# Patient Record
Sex: Female | Born: 1975 | Race: Asian | Hispanic: No | Marital: Married | Smoking: Never smoker
Health system: Southern US, Community
[De-identification: ages and names within clinical notes are randomized; demographics above are authoritative.]

## PROBLEM LIST (undated history)

## (undated) DIAGNOSIS — Z789 Other specified health status: Secondary | ICD-10-CM

## (undated) DIAGNOSIS — O24419 Gestational diabetes mellitus in pregnancy, unspecified control: Secondary | ICD-10-CM

## (undated) HISTORY — DX: Gestational diabetes mellitus in pregnancy, unspecified control: O24.419

## (undated) HISTORY — PX: OTHER SURGICAL HISTORY: SHX169

## (undated) HISTORY — PX: NO PAST SURGERIES: SHX2092

---

## 2007-01-02 ENCOUNTER — Inpatient Hospital Stay (HOSPITAL_COMMUNITY): Admission: AD | Admit: 2007-01-02 | Discharge: 2007-01-02 | Payer: Self-pay | Admitting: Gynecology

## 2007-01-02 ENCOUNTER — Ambulatory Visit: Payer: Self-pay | Admitting: Obstetrics and Gynecology

## 2007-01-03 ENCOUNTER — Inpatient Hospital Stay (HOSPITAL_COMMUNITY): Admission: AD | Admit: 2007-01-03 | Discharge: 2007-01-03 | Payer: Self-pay | Admitting: Obstetrics & Gynecology

## 2007-01-08 ENCOUNTER — Ambulatory Visit: Payer: Self-pay | Admitting: Obstetrics & Gynecology

## 2007-01-15 ENCOUNTER — Inpatient Hospital Stay (HOSPITAL_COMMUNITY): Admission: AD | Admit: 2007-01-15 | Discharge: 2007-01-16 | Payer: Self-pay | Admitting: Gynecology

## 2007-01-15 ENCOUNTER — Ambulatory Visit: Payer: Self-pay | Admitting: Advanced Practice Midwife

## 2010-12-05 LAB — POCT URINALYSIS DIP (DEVICE)
Glucose, UA: NEGATIVE
Ketones, ur: NEGATIVE
Operator id: 297281
Protein, ur: NEGATIVE
Specific Gravity, Urine: 1.02
Urobilinogen, UA: 0.2

## 2010-12-05 LAB — URINE MICROSCOPIC-ADD ON

## 2010-12-05 LAB — URINALYSIS, ROUTINE W REFLEX MICROSCOPIC
Nitrite: NEGATIVE
Protein, ur: NEGATIVE
Specific Gravity, Urine: 1.01
Urobilinogen, UA: 0.2

## 2010-12-05 LAB — CBC
Hemoglobin: 13.6
MCHC: 34.3
RBC: 4.53
WBC: 9.2

## 2010-12-05 LAB — DIFFERENTIAL
Basophils Relative: 0
Lymphocytes Relative: 16
Lymphs Abs: 1.4
Monocytes Absolute: 0.7
Monocytes Relative: 8
Neutro Abs: 6.8
Neutrophils Relative %: 75

## 2010-12-05 LAB — STREP B DNA PROBE: Strep Group B Ag: NEGATIVE

## 2010-12-05 LAB — TYPE AND SCREEN: Antibody Screen: POSITIVE

## 2010-12-05 LAB — RPR: RPR Ser Ql: NONREACTIVE

## 2010-12-05 LAB — HIV ANTIBODY (ROUTINE TESTING W REFLEX): HIV: NONREACTIVE

## 2010-12-05 LAB — GC/CHLAMYDIA PROBE AMP, GENITAL: GC Probe Amp, Genital: NEGATIVE

## 2010-12-05 LAB — RUBELLA SCREEN: Rubella: >500 — ABNORMAL HIGH

## 2012-02-27 NOTE — L&D Delivery Note (Signed)
Attestation of Attending Supervision of Advanced Practitioner (CNM/NP): Evaluation and management procedures were performed by the Advanced Practitioner under my supervision and collaboration. I have reviewed the Advanced Practitioner's note and chart, and I agree with the management and plan.  Lada Fulbright H. 11:26 AM   

## 2012-02-27 NOTE — L&D Delivery Note (Signed)
Delivery Note At 4:53 AM a viable female was delivered via Vaginal, Spontaneous Delivery (Presentation: ; Occiput Anterior).  APGAR: 8, 9; weight .   Placenta status: Intact, Spontaneous.  Cord: 2 vessels with the following complications: None.  Cord pH:  Anesthesia: None, local Episiotomy: Median Lacerations: 1st degree;Perineal Suture Repair: 3.0 vicryl Est. Blood Loss (mL):   Mom to postpartum.  Baby to nursery-stable.  Tawnya Crook 10/22/2012, 5:33 AM

## 2012-03-13 ENCOUNTER — Encounter: Payer: Self-pay | Admitting: Obstetrics & Gynecology

## 2012-03-13 ENCOUNTER — Ambulatory Visit (INDEPENDENT_AMBULATORY_CARE_PROVIDER_SITE_OTHER): Payer: BC Managed Care – PPO | Admitting: Obstetrics & Gynecology

## 2012-03-13 VITALS — BP 109/69 | HR 65 | Ht 64.0 in | Wt 158.0 lb

## 2012-03-13 DIAGNOSIS — Z124 Encounter for screening for malignant neoplasm of cervix: Secondary | ICD-10-CM

## 2012-03-13 DIAGNOSIS — Z01419 Encounter for gynecological examination (general) (routine) without abnormal findings: Secondary | ICD-10-CM

## 2012-03-13 DIAGNOSIS — N912 Amenorrhea, unspecified: Secondary | ICD-10-CM

## 2012-03-13 DIAGNOSIS — Z3201 Encounter for pregnancy test, result positive: Secondary | ICD-10-CM

## 2012-03-13 DIAGNOSIS — Z1151 Encounter for screening for human papillomavirus (HPV): Secondary | ICD-10-CM

## 2012-03-13 DIAGNOSIS — Z113 Encounter for screening for infections with a predominantly sexual mode of transmission: Secondary | ICD-10-CM

## 2012-03-13 LAB — POCT URINE PREGNANCY: Preg Test, Ur: POSITIVE

## 2012-03-13 NOTE — Patient Instructions (Signed)
Preventive Care for Adults, Female A healthy lifestyle and preventive care can promote health and wellness. Preventive health guidelines for women include the following key practices.  A routine yearly physical is a good way to check with your caregiver about your health and preventive screening. It is a chance to share any concerns and updates on your health, and to receive a thorough exam.  Visit your dentist for a routine exam and preventive care every 6 months. Brush your teeth twice a day and floss once a day. Good oral hygiene prevents tooth decay and gum disease.  The frequency of eye exams is based on your age, health, family medical history, use of contact lenses, and other factors. Follow your caregiver's recommendations for frequency of eye exams.  Eat a healthy diet. Foods like vegetables, fruits, whole grains, low-fat dairy products, and lean protein foods contain the nutrients you need without too many calories. Decrease your intake of foods high in solid fats, added sugars, and salt. Eat the right amount of calories for you.Get information about a proper diet from your caregiver, if necessary.  Regular physical exercise is one of the most important things you can do for your health. Most adults should get at least 150 minutes of moderate-intensity exercise (any activity that increases your heart rate and causes you to sweat) each week. In addition, most adults need muscle-strengthening exercises on 2 or more days a week.  Maintain a healthy weight. The body mass index (BMI) is a screening tool to identify possible weight problems. It provides an estimate of body fat based on height and weight. Your caregiver can help determine your BMI, and can help you achieve or maintain a healthy weight.For adults 20 years and older:  A BMI below 18.5 is considered underweight.  A BMI of 18.5 to 24.9 is normal.  A BMI of 25 to 29.9 is considered overweight.  A BMI of 30 and above is  considered obese.  Maintain normal blood lipids and cholesterol levels by exercising and minimizing your intake of saturated fat. Eat a balanced diet with plenty of fruit and vegetables. Blood tests for lipids and cholesterol should begin at age 41 and be repeated every 5 years. If your lipid or cholesterol levels are high, you are over 50, or you are at high risk for heart disease, you may need your cholesterol levels checked more frequently.Ongoing high lipid and cholesterol levels should be treated with medicines if diet and exercise are not effective.  If you smoke, find out from your caregiver how to quit. If you do not use tobacco, do not start.  If you are pregnant, do not drink alcohol. If you are breastfeeding, be very cautious about drinking alcohol. If you are not pregnant and choose to drink alcohol, do not exceed 1 drink per day. One drink is considered to be 12 ounces (355 mL) of beer, 5 ounces (148 mL) of wine, or 1.5 ounces (44 mL) of liquor.  Avoid use of street drugs. Do not share needles with anyone. Ask for help if you need support or instructions about stopping the use of drugs.  High blood pressure causes heart disease and increases the risk of stroke. Your blood pressure should be checked at least every 1 to 2 years. Ongoing high blood pressure should be treated with medicines if weight loss and exercise are not effective.  If you are 65 to 37 years old, ask your caregiver if you should take aspirin to prevent strokes.  Diabetes  screening involves taking a blood sample to check your fasting blood sugar level. This should be done once every 3 years, after age 45, if you are within normal weight and without risk factors for diabetes. Testing should be considered at a younger age or be carried out more frequently if you are overweight and have at least 1 risk factor for diabetes.  Breast cancer screening is essential preventive care for women. You should practice "breast  self-awareness." This means understanding the normal appearance and feel of your breasts and may include breast self-examination. Any changes detected, no matter how small, should be reported to a caregiver. Women in their 20s and 30s should have a clinical breast exam (CBE) by a caregiver as part of a regular health exam every 1 to 3 years. After age 40, women should have a CBE every year. Starting at age 40, women should consider having a mammography (breast X-ray test) every year. Women who have a family history of breast cancer should talk to their caregiver about genetic screening. Women at a high risk of breast cancer should talk to their caregivers about having magnetic resonance imaging (MRI) and a mammography every year.  The Pap test is a screening test for cervical cancer. A Pap test can show cell changes on the cervix that might become cervical cancer if left untreated. A Pap test is a procedure in which cells are obtained and examined from the lower end of the uterus (cervix).  Women should have a Pap test starting at age 21.  Between ages 21 and 29, Pap tests should be repeated every 2 years.  Beginning at age 30, you should have a Pap test every 3 years as long as the past 3 Pap tests have been normal.  Some women have medical problems that increase the chance of getting cervical cancer. Talk to your caregiver about these problems. It is especially important to talk to your caregiver if a new problem develops soon after your last Pap test. In these cases, your caregiver may recommend more frequent screening and Pap tests.  The above recommendations are the same for women who have or have not gotten the vaccine for human papillomavirus (HPV).  If you had a hysterectomy for a problem that was not cancer or a condition that could lead to cancer, then you no longer need Pap tests. Even if you no longer need a Pap test, a regular exam is a good idea to make sure no other problems are  starting.  If you are between ages 65 and 70, and you have had normal Pap tests going back 10 years, you no longer need Pap tests. Even if you no longer need a Pap test, a regular exam is a good idea to make sure no other problems are starting.  If you have had past treatment for cervical cancer or a condition that could lead to cancer, you need Pap tests and screening for cancer for at least 20 years after your treatment.  If Pap tests have been discontinued, risk factors (such as a new sexual partner) need to be reassessed to determine if screening should be resumed.  The HPV test is an additional test that may be used for cervical cancer screening. The HPV test looks for the virus that can cause the cell changes on the cervix. The cells collected during the Pap test can be tested for HPV. The HPV test could be used to screen women aged 30 years and older, and should   be used in women of any age who have unclear Pap test results. After the age of 30, women should have HPV testing at the same frequency as a Pap test.  Colorectal cancer can be detected and often prevented. Most routine colorectal cancer screening begins at the age of 50 and continues through age 75. However, your caregiver may recommend screening at an earlier age if you have risk factors for colon cancer. On a yearly basis, your caregiver may provide home test kits to check for hidden blood in the stool. Use of a small camera at the end of a tube, to directly examine the colon (sigmoidoscopy or colonoscopy), can detect the earliest forms of colorectal cancer. Talk to your caregiver about this at age 50, when routine screening begins. Direct examination of the colon should be repeated every 5 to 10 years through age 75, unless early forms of pre-cancerous polyps or small growths are found.  Hepatitis C blood testing is recommended for all people born from 1945 through 1965 and any individual with known risks for hepatitis C.  Practice  safe sex. Use condoms and avoid high-risk sexual practices to reduce the spread of sexually transmitted infections (STIs). STIs include gonorrhea, chlamydia, syphilis, trichomonas, herpes, HPV, and human immunodeficiency virus (HIV). Herpes, HIV, and HPV are viral illnesses that have no cure. They can result in disability, cancer, and death. Sexually active women aged 25 and younger should be checked for chlamydia. Older women with new or multiple partners should also be tested for chlamydia. Testing for other STIs is recommended if you are sexually active and at increased risk.  Osteoporosis is a disease in which the bones lose minerals and strength with aging. This can result in serious bone fractures. The risk of osteoporosis can be identified using a bone density scan. Women ages 65 and over and women at risk for fractures or osteoporosis should discuss screening with their caregivers. Ask your caregiver whether you should take a calcium supplement or vitamin D to reduce the rate of osteoporosis.  Menopause can be associated with physical symptoms and risks. Hormone replacement therapy is available to decrease symptoms and risks. You should talk to your caregiver about whether hormone replacement therapy is right for you.  Use sunscreen with sun protection factor (SPF) of 30 or more. Apply sunscreen liberally and repeatedly throughout the day. You should seek shade when your shadow is shorter than you. Protect yourself by wearing long sleeves, pants, a wide-brimmed hat, and sunglasses year round, whenever you are outdoors.  Once a month, do a whole body skin exam, using a mirror to look at the skin on your back. Notify your caregiver of new moles, moles that have irregular borders, moles that are larger than a pencil eraser, or moles that have changed in shape or color.  Stay current with required immunizations.  Influenza. You need a dose every fall (or winter). The composition of the flu vaccine  changes each year, so being vaccinated once is not enough.  Pneumococcal polysaccharide. You need 1 to 2 doses if you smoke cigarettes or if you have certain chronic medical conditions. You need 1 dose at age 65 (or older) if you have never been vaccinated.  Tetanus, diphtheria, pertussis (Tdap, Td). Get 1 dose of Tdap vaccine if you are younger than age 65, are over 65 and have contact with an infant, are a healthcare worker, are pregnant, or simply want to be protected from whooping cough. After that, you need a Td   booster dose every 10 years. Consult your caregiver if you have not had at least 3 tetanus and diphtheria-containing shots sometime in your life or have a deep or dirty wound.  HPV. You need this vaccine if you are a woman age 26 or younger. The vaccine is given in 3 doses over 6 months.  Measles, mumps, rubella (MMR). You need at least 1 dose of MMR if you were born in 1957 or later. You may also need a second dose.  Meningococcal. If you are age 19 to 21 and a first-year college student living in a residence hall, or have one of several medical conditions, you need to get vaccinated against meningococcal disease. You may also need additional booster doses.  Zoster (shingles). If you are age 60 or older, you should get this vaccine.  Varicella (chickenpox). If you have never had chickenpox or you were vaccinated but received only 1 dose, talk to your caregiver to find out if you need this vaccine.  Hepatitis A. You need this vaccine if you have a specific risk factor for hepatitis A virus infection or you simply wish to be protected from this disease. The vaccine is usually given as 2 doses, 6 to 18 months apart.  Hepatitis B. You need this vaccine if you have a specific risk factor for hepatitis B virus infection or you simply wish to be protected from this disease. The vaccine is given in 3 doses, usually over 6 months. Preventive Services / Frequency Ages 19 to 39  Blood  pressure check.** / Every 1 to 2 years.  Lipid and cholesterol check.** / Every 5 years beginning at age 20.  Clinical breast exam.** / Every 3 years for women in their 20s and 30s.  Pap test.** / Every 2 years from ages 21 through 29. Every 3 years starting at age 30 through age 65 or 70 with a history of 3 consecutive normal Pap tests.  HPV screening.** / Every 3 years from ages 30 through ages 65 to 70 with a history of 3 consecutive normal Pap tests.  Hepatitis C blood test.** / For any individual with known risks for hepatitis C.  Skin self-exam. / Monthly.  Influenza immunization.** / Every year.  Pneumococcal polysaccharide immunization.** / 1 to 2 doses if you smoke cigarettes or if you have certain chronic medical conditions.  Tetanus, diphtheria, pertussis (Tdap, Td) immunization. / A one-time dose of Tdap vaccine. After that, you need a Td booster dose every 10 years.  HPV immunization. / 3 doses over 6 months, if you are 26 and younger.  Measles, mumps, rubella (MMR) immunization. / You need at least 1 dose of MMR if you were born in 1957 or later. You may also need a second dose.  Meningococcal immunization. / 1 dose if you are age 19 to 21 and a first-year college student living in a residence hall, or have one of several medical conditions, you need to get vaccinated against meningococcal disease. You may also need additional booster doses.  Varicella immunization.** / Consult your caregiver.  Hepatitis A immunization.** / Consult your caregiver. 2 doses, 6 to 18 months apart.  Hepatitis B immunization.** / Consult your caregiver. 3 doses usually over 6 months. Ages 40 to 64  Blood pressure check.** / Every 1 to 2 years.  Lipid and cholesterol check.** / Every 5 years beginning at age 20.  Clinical breast exam.** / Every year after age 40.  Mammogram.** / Every year beginning at age 40   and continuing for as long as you are in good health. Consult with your  caregiver.  Pap test.** / Every 3 years starting at age 30 through age 65 or 70 with a history of 3 consecutive normal Pap tests.  HPV screening.** / Every 3 years from ages 30 through ages 65 to 70 with a history of 3 consecutive normal Pap tests.  Fecal occult blood test (FOBT) of stool. / Every year beginning at age 50 and continuing until age 75. You may not need to do this test if you get a colonoscopy every 10 years.  Flexible sigmoidoscopy or colonoscopy.** / Every 5 years for a flexible sigmoidoscopy or every 10 years for a colonoscopy beginning at age 50 and continuing until age 75.  Hepatitis C blood test.** / For all people born from 1945 through 1965 and any individual with known risks for hepatitis C.  Skin self-exam. / Monthly.  Influenza immunization.** / Every year.  Pneumococcal polysaccharide immunization.** / 1 to 2 doses if you smoke cigarettes or if you have certain chronic medical conditions.  Tetanus, diphtheria, pertussis (Tdap, Td) immunization.** / A one-time dose of Tdap vaccine. After that, you need a Td booster dose every 10 years.  Measles, mumps, rubella (MMR) immunization. / You need at least 1 dose of MMR if you were born in 1957 or later. You may also need a second dose.  Varicella immunization.** / Consult your caregiver.  Meningococcal immunization.** / Consult your caregiver.  Hepatitis A immunization.** / Consult your caregiver. 2 doses, 6 to 18 months apart.  Hepatitis B immunization.** / Consult your caregiver. 3 doses, usually over 6 months. Ages 65 and over  Blood pressure check.** / Every 1 to 2 years.  Lipid and cholesterol check.** / Every 5 years beginning at age 20.  Clinical breast exam.** / Every year after age 40.  Mammogram.** / Every year beginning at age 40 and continuing for as long as you are in good health. Consult with your caregiver.  Pap test.** / Every 3 years starting at age 30 through age 65 or 70 with a 3  consecutive normal Pap tests. Testing can be stopped between 65 and 70 with 3 consecutive normal Pap tests and no abnormal Pap or HPV tests in the past 10 years.  HPV screening.** / Every 3 years from ages 30 through ages 65 or 70 with a history of 3 consecutive normal Pap tests. Testing can be stopped between 65 and 70 with 3 consecutive normal Pap tests and no abnormal Pap or HPV tests in the past 10 years.  Fecal occult blood test (FOBT) of stool. / Every year beginning at age 50 and continuing until age 75. You may not need to do this test if you get a colonoscopy every 10 years.  Flexible sigmoidoscopy or colonoscopy.** / Every 5 years for a flexible sigmoidoscopy or every 10 years for a colonoscopy beginning at age 50 and continuing until age 75.  Hepatitis C blood test.** / For all people born from 1945 through 1965 and any individual with known risks for hepatitis C.  Osteoporosis screening.** / A one-time screening for women ages 65 and over and women at risk for fractures or osteoporosis.  Skin self-exam. / Monthly.  Influenza immunization.** / Every year.  Pneumococcal polysaccharide immunization.** / 1 dose at age 65 (or older) if you have never been vaccinated.  Tetanus, diphtheria, pertussis (Tdap, Td) immunization. / A one-time dose of Tdap vaccine if you are over   65 and have contact with an infant, are a Research scientist (physical sciences), or simply want to be protected from whooping cough. After that, you need a Td booster dose every 10 years.  Varicella immunization.** / Consult your caregiver.  Meningococcal immunization.** / Consult your caregiver.  Hepatitis A immunization.** / Consult your caregiver. 2 doses, 6 to 18 months apart.  Hepatitis B immunization.** / Check with your caregiver. 3 doses, usually over 6 months. ** Family history and personal history of risk and conditions may change your caregiver's recommendations. Document Released: 04/10/2001 Document Revised: 05/07/2011  Document Reviewed: 07/10/2010 [facility] Patient Information 2013 [place], [place].  Thank you for enrolling in MyChart. Please follow the instructions below to securely access your online medical record. MyChart allows you to send messages to your doctor, view your test results, manage appointments, and more.   How Do I Sign Up? 1. In your Internet browser, go to [employer] and enter [email or web address]. 2. Click on the Sign Up Now link in the Sign In box. You will see the New Member Sign Up page. 3. Enter your MyChart Access Code exactly as it appears below. You will not need to use this code after you've completed the sign-up process. If you do not sign up before the expiration date, you must request a new code. MyChart Access Code: [email or web address] Expires: 04/12/2012  8:59 AM  4. Enter your Social Security Number ([id number]) and Date of Birth (mm/dd/yyyy) as indicated and click Submit. You will be taken to the next sign-up page. 5. Create a MyChart ID. This will be your MyChart login ID and cannot be changed, so think of one that is secure and easy to remember. 6. Create a MyChart password. You can change your password at any time. 7. Enter your Password Reset Question and Answer. This can be used at a later time if you forget your password.  8. Enter your e-mail address. You will receive e-mail notification when new information is available in MyChart. 9. Click Sign Up. You can now view your medical record.   Additional Information Remember, MyChart is NOT to be used for urgent needs. For medical emergencies, dial 911.

## 2012-03-13 NOTE — Progress Notes (Signed)
  Subjective:     [name] [name] is a 37 y.o. [phone] female and is here for a comprehensive physical exam. She had a urine pregnancy here today and just found out she was pregnant.  LMP 02/02/12; this makes her  [redacted]w[redacted]d, EDD 11/08/2012.  The patient reports no problems, just surprised she was pregnant because she was using rhythm method and condoms for contraception for past five years.  History   Social History  . Marital Status: Married    Spouse Name: N/A    Number of Children: N/A  . Years of Education: N/A   Occupational History  . Not on file.   Social History Main Topics  . Smoking status: Never Smoker   . Smokeless tobacco: Not on file  . Alcohol Use: No  . Drug Use: No  . Sexually Active: Yes -- Female partner(s)    Birth Control/ Protection: Condom, None   Other Topics Concern  . Not on file   Social History Narrative  . No narrative on file   No health maintenance topics applied.  The following portions of the patient's history were reviewed and updated as appropriate: allergies, current medications, past family history, past medical history, past social history, past surgical history and problem list.  Review of Systems A comprehensive review of systems was negative.   Objective:   BP 109/69  Pulse 65  Ht 5\' 4"  (1.626 m)  Wt 158 lb (71.668 kg)  BMI 27.12 kg/m2  LMP 02/02/2012 GENERAL: Well-developed, well-nourished female in no acute distress.  HEENT: Normocephalic, atraumatic. Sclerae anicteric.  NECK: Supple. Normal thyroid.  LUNGS: Clear to auscultation bilaterally.  HEART: Regular rate and rhythm. BREASTS: Symmetric in size. No masses, skin changes, nipple drainage, or lymphadenopathy. ABDOMEN: Soft, nontender, nondistended. No organomegaly. PELVIC: Normal external female genitalia. Vagina is pink and rugated.  Normal discharge. Normal cervix contour. Pap smear obtained. Uterus is normal in size. No adnexal mass or tenderness.  EXTREMITIES: No cyanosis,  clubbing, or edema, 2+ distal pulses.   Assessment:    Healthy female exam.  Incidental positive pregnancy test     Plan:    Pap done, will follow up results and manage accordingly. Told to start prenatal vitamins, avoid alcohol, drugs or other toxic agents.  She will return in about one month for initial OB visit. Routine preventative health maintenance measures emphasized

## 2012-04-07 ENCOUNTER — Ambulatory Visit (INDEPENDENT_AMBULATORY_CARE_PROVIDER_SITE_OTHER): Payer: BC Managed Care – PPO | Admitting: Gynecology

## 2012-04-07 ENCOUNTER — Encounter: Payer: Self-pay | Admitting: Gynecology

## 2012-04-07 VITALS — BP 120/66 | Wt 163.0 lb

## 2012-04-07 DIAGNOSIS — Z3480 Encounter for supervision of other normal pregnancy, unspecified trimester: Secondary | ICD-10-CM

## 2012-04-08 LAB — OBSTETRIC PANEL
Antibody Screen: NEGATIVE
Basophils Absolute: 0 10*3/uL (ref 0.0–0.1)
Basophils Relative: 0 % (ref 0–1)
Eosinophils Absolute: 0.1 10*3/uL (ref 0.0–0.7)
Eosinophils Relative: 1 % (ref 0–5)
HCT: 35.3 % — ABNORMAL LOW (ref 36.0–46.0)
Hemoglobin: 12.2 g/dL (ref 12.0–15.0)
Hepatitis B Surface Ag: NEGATIVE
Lymphocytes Relative: 20 % (ref 12–46)
Lymphs Abs: 1.7 10*3/uL (ref 0.7–4.0)
MCH: 29.8 pg (ref 26.0–34.0)
MCHC: 34.6 g/dL (ref 30.0–36.0)
MCV: 86.1 fL (ref 78.0–100.0)
Monocytes Absolute: 0.6 10*3/uL (ref 0.1–1.0)
Monocytes Relative: 7 % (ref 3–12)
Neutro Abs: 6.2 10*3/uL (ref 1.7–7.7)
Neutrophils Relative %: 72 % (ref 43–77)
Platelets: 327 10*3/uL (ref 150–400)
RBC: 4.1 MIL/uL (ref 3.87–5.11)
RDW: 13.8 % (ref 11.5–15.5)
Rh Type: POSITIVE
Rubella: 11.3 Index — ABNORMAL HIGH (ref <0.90)
WBC: 8.7 10*3/uL (ref 4.0–10.5)

## 2012-04-08 LAB — CULTURE, URINE COMPREHENSIVE: Colony Count: NO GROWTH

## 2012-04-21 ENCOUNTER — Ambulatory Visit: Payer: BC Managed Care – PPO | Admitting: *Deleted

## 2012-04-21 DIAGNOSIS — O219 Vomiting of pregnancy, unspecified: Secondary | ICD-10-CM

## 2012-04-21 MED ORDER — PROMETHAZINE HCL 25 MG PO TABS: 25.0000 mg | ORAL_TABLET | Freq: Four times a day (QID) | ORAL | Status: DC | PRN

## 2012-04-21 NOTE — Progress Notes (Signed)
Last night about one hour after eating patient starting throwing up.  Three days ago her little boy had some vomiting as well, but he is ok now.  She is still sick this morning, her stomach is cramping and unable to keep anything down.  No vaginal bleeding at all.  She has her first appointment with doctor in March.

## 2012-05-01 ENCOUNTER — Ambulatory Visit (INDEPENDENT_AMBULATORY_CARE_PROVIDER_SITE_OTHER): Payer: BC Managed Care – PPO | Admitting: Obstetrics & Gynecology

## 2012-05-01 ENCOUNTER — Encounter: Payer: Self-pay | Admitting: Obstetrics & Gynecology

## 2012-05-01 VITALS — BP 120/76 | Wt 158.0 lb

## 2012-05-01 DIAGNOSIS — O09521 Supervision of elderly multigravida, first trimester: Secondary | ICD-10-CM

## 2012-05-01 DIAGNOSIS — O09529 Supervision of elderly multigravida, unspecified trimester: Secondary | ICD-10-CM

## 2012-05-01 DIAGNOSIS — Z3480 Encounter for supervision of other normal pregnancy, unspecified trimester: Secondary | ICD-10-CM

## 2012-05-01 NOTE — Patient Instructions (Signed)
Pregnancy - First Trimester During sexual intercourse, millions of sperm go into the vagina. Only 1 sperm will penetrate and fertilize the female egg while it is in the Fallopian tube. One week later, the fertilized egg implants into the wall of the uterus. An embryo begins to develop into a baby. At 6 to 8 weeks, the eyes and face are formed and the heartbeat can be seen on ultrasound. At the end of 12 weeks (first trimester), all the baby's organs are formed. Now that you are pregnant, you will want to do everything you can to have a healthy baby. Two of the most important things are to get good prenatal care and follow your caregiver's instructions. Prenatal care is all the medical care you receive before the baby's birth. It is given to prevent, find, and treat problems during the pregnancy and childbirth. PRENATAL EXAMS  During prenatal visits, your weight, blood pressure and urine are checked. This is done to make sure you are healthy and progressing normally during the pregnancy.  A pregnant woman should gain 25 to 35 pounds during the pregnancy. However, if you are over weight or underweight, your caregiver will advise you regarding your weight.  Your caregiver will ask and answer questions for you.  Blood work, cervical cultures, other necessary tests and a Pap test are done during your prenatal exams. These tests are done to check on your health and the probable health of your baby. Tests are strongly recommended and done for HIV with your permission. This is the virus that causes AIDS. These tests are done because medications can be given to help prevent your baby from being born with this infection should you have been infected without knowing it. Blood work is also used to find out your blood type, previous infections and follow your blood levels (hemoglobin).  Low hemoglobin (anemia) is common during pregnancy. Iron and vitamins are given to help prevent this. Later in the pregnancy,  blood tests for diabetes will be done along with any other tests if any problems develop. You may need tests to make sure you and the baby are doing well.  You may need other tests to make sure you and the baby are doing well. CHANGES DURING THE FIRST TRIMESTER (THE FIRST 3 MONTHS OF PREGNANCY) Your body goes through many changes during pregnancy. They vary from person to person. Talk to your caregiver about changes you notice and are concerned about. Changes can include:  Your menstrual period stops.  The egg and sperm carry the genes that determine what you look like. Genes from you and your partner are forming a baby. The female genes determine whether the baby is a boy or a girl.  Your body increases in girth and you may feel bloated.  Feeling sick to your stomach (nauseous) and throwing up (vomiting). If the vomiting is uncontrollable, call your caregiver.  Your breasts will begin to enlarge and become tender.  Your nipples may stick out more and become darker.  The need to urinate more. Painful urination may mean you have a bladder infection.  Tiring easily.  Loss of appetite.  Cravings for certain kinds of food.  At first, you may gain or lose a couple of pounds.  You may have changes in your emotions from day to day (excited to be pregnant or concerned something may go wrong with the pregnancy and baby).  You may have more vivid and strange dreams. HOME CARE INSTRUCTIONS   It is very important   to avoid all smoking, alcohol and un-prescribed drugs during your pregnancy. These affect the formation and growth of the baby. Avoid chemicals while pregnant to ensure the delivery of a healthy infant.  Start your prenatal visits by the 12th week of pregnancy. They are usually scheduled monthly at first, then more often in the last 2 months before delivery. Keep your caregiver's appointments. Follow your caregiver's instructions regarding medication use, blood and lab tests, exercise,  and diet.  During pregnancy, you are providing food for you and your baby. Eat regular, well-balanced meals. Choose foods such as meat, fish, milk and other low fat dairy products, vegetables, fruits, and whole-grain breads and cereals. Your caregiver will tell you of the ideal weight gain.  You can help morning sickness by keeping soda crackers at the bedside. Eat a couple before arising in the morning. You may want to use the crackers without salt on them.  Eating 4 to 5 small meals rather than 3 large meals a day also may help the nausea and vomiting.  Drinking liquids between meals instead of during meals also seems to help nausea and vomiting.  A physical sexual relationship may be continued throughout pregnancy if there are no other problems. Problems may be early (premature) leaking of amniotic fluid from the membranes, vaginal bleeding, or belly (abdominal) pain.  Exercise regularly if there are no restrictions. Check with your caregiver or physical therapist if you are unsure of the safety of some of your exercises. Greater weight gain will occur in the last 2 trimesters of pregnancy. Exercising will help:  Control your weight.  Keep you in shape.  Prepare you for labor and delivery.  Help you lose your pregnancy weight after you deliver your baby.  Wear a good support or jogging bra for breast tenderness during pregnancy. This may help if worn during sleep too.  Ask when prenatal classes are available. Begin classes when they are offered.  Do not use hot tubs, steam rooms or saunas.  Wear your seat belt when driving. This protects you and your baby if you are in an accident.  Avoid raw meat, uncooked cheese, cat litter boxes and soil used by cats throughout the pregnancy. These carry germs that can cause birth defects in the baby.  The first trimester is a good time to visit your dentist for your dental health. Getting your teeth cleaned is OK. Use a softer toothbrush and  brush gently during pregnancy.  Ask for help if you have financial, counseling or nutritional needs during pregnancy. Your caregiver will be able to offer counseling for these needs as well as refer you for other special needs.  Do not take any medications or herbs unless told by your caregiver.  Inform your caregiver if there is any mental or physical domestic violence.  Make a list of emergency phone numbers of family, friends, hospital, and police and fire departments.  Write down your questions. Take them to your prenatal visit.  Do not douche.  Do not cross your legs.  If you have to stand for long periods of time, rotate you feet or take small steps in a circle.  You may have more vaginal secretions that may require a sanitary pad. Do not use tampons or scented sanitary pads. MEDICATIONS AND DRUG USE IN PREGNANCY  Take prenatal vitamins as directed. The vitamin should contain 1 milligram of folic acid. Keep all vitamins out of reach of children. Only a couple vitamins or tablets containing iron may be   fatal to a baby or young child when ingested.  Avoid use of all medications, including herbs, over-the-counter medications, not prescribed or suggested by your caregiver. Only take over-the-counter or prescription medicines for pain, discomfort, or fever as directed by your caregiver. Do not use aspirin, ibuprofen, or naproxen unless directed by your caregiver.  Let your caregiver also know about herbs you may be using.  Alcohol is related to a number of birth defects. This includes fetal alcohol syndrome. All alcohol, in any form, should be avoided completely. Smoking will cause low birth rate and premature babies.  Street or illegal drugs are very harmful to the baby. They are absolutely forbidden. A baby born to an addicted mother will be addicted at birth. The baby will go through the same withdrawal an adult does.  Let your caregiver know about any medications that you have to  take and for what reason you take them. MISCARRIAGE IS COMMON DURING PREGNANCY A miscarriage does not mean you did something wrong. It is not a reason to worry about getting pregnant again. Your caregiver will help you with questions you may have. If you have a miscarriage, you may need minor surgery. SEEK MEDICAL CARE IF:  You have any concerns or worries during your pregnancy. It is better to call with your questions if you feel they cannot wait, rather than worry about them. SEEK IMMEDIATE MEDICAL CARE IF:   An unexplained oral temperature above 102 F (38.9 C) develops, or as your caregiver suggests.  You have leaking of fluid from the vagina (birth canal). If leaking membranes are suspected, take your temperature and inform your caregiver of this when you call.  There is vaginal spotting or bleeding. Notify your caregiver of the amount and how many pads are used.  You develop a bad smelling vaginal discharge with a change in the color.  You continue to feel sick to your stomach (nauseated) and have no relief from remedies suggested. You vomit blood or coffee ground-like materials.  You lose more than 2 pounds of weight in 1 week.  You gain more than 2 pounds of weight in 1 week and you notice swelling of your face, hands, feet, or legs.  You gain 5 pounds or more in 1 week (even if you do not have swelling of your hands, face, legs, or feet).  You get exposed to German measles and have never had them.  You are exposed to fifth disease or chickenpox.  You develop belly (abdominal) pain. Round ligament discomfort is a common non-cancerous (benign) cause of abdominal pain in pregnancy. Your caregiver still must evaluate this.  You develop headache, fever, diarrhea, pain with urination, or shortness of breath.  You fall or are in a car accident or have any kind of trauma.  There is mental or physical violence in your home. Document Released: 02/06/2001 Document Revised: 05/07/2011  Document Reviewed: 08/10/2008 [employer] Patient Information 2013 [employer].  Pregnancy - Second Trimester The second trimester of pregnancy (3 to 6 months) is a period of rapid growth for you and your baby. At the end of the sixth month, your baby is about 9 inches long and weighs 1 1/2 pounds. You will begin to feel the baby move between 18 and 20 weeks of the pregnancy. This is called quickening. Weight gain is faster. A clear fluid (colostrum) may leak out of your breasts. You may feel small contractions of the womb (uterus). This is known as false labor or Braxton-Hicks contractions. This   is like a practice for labor when the baby is ready to be born. Usually, the problems with morning sickness have usually passed by the end of your first trimester. Some women develop small dark blotches (called cholasma, mask of pregnancy) on their face that usually goes away after the baby is born. Exposure to the sun makes the blotches worse. Acne may also develop in some pregnant women and pregnant women who have acne, may find that it goes away. PRENATAL EXAMS  Blood work may continue to be done during prenatal exams. These tests are done to check on your health and the probable health of your baby. Blood work is used to follow your blood levels (hemoglobin). Anemia (low hemoglobin) is common during pregnancy. Iron and vitamins are given to help prevent this. You will also be checked for diabetes between 24 and 28 weeks of the pregnancy. Some of the previous blood tests may be repeated.  The size of the uterus is measured during each visit. This is to make sure that the baby is continuing to grow properly according to the dates of the pregnancy.  Your blood pressure is checked every prenatal visit. This is to make sure you are not getting toxemia.  Your urine is checked to make sure you do not have an infection, diabetes or protein in the urine.  Your weight is checked often to make sure gains are  happening at the suggested rate. This is to ensure that both you and your baby are growing normally.  Sometimes, an ultrasound is performed to confirm the proper growth and development of the baby. This is a test which bounces harmless sound waves off the baby so your caregiver can more accurately determine due dates. Sometimes, a specialized test is done on the amniotic fluid surrounding the baby. This test is called an amniocentesis. The amniotic fluid is obtained by sticking a needle into the belly (abdomen). This is done to check the chromosomes in instances where there is a concern about possible genetic problems with the baby. It is also sometimes done near the end of pregnancy if an early delivery is required. In this case, it is done to help make sure the baby's lungs are mature enough for the baby to live outside of the womb. CHANGES OCCURING IN THE SECOND TRIMESTER OF PREGNANCY Your body goes through many changes during pregnancy. They vary from person to person. Talk to your caregiver about changes you notice that you are concerned about.  During the second trimester, you will likely have an increase in your appetite. It is normal to have cravings for certain foods. This varies from person to person and pregnancy to pregnancy.  Your lower abdomen will begin to bulge.  You may have to urinate more often because the uterus and baby are pressing on your bladder. It is also common to get more bladder infections during pregnancy (pain with urination). You can help this by drinking lots of fluids and emptying your bladder before and after intercourse.  You may begin to get stretch marks on your hips, abdomen, and breasts. These are normal changes in the body during pregnancy. There are no exercises or medications to take that prevent this change.  You may begin to develop swollen and bulging veins (varicose veins) in your legs. Wearing support hose, elevating your feet for 15 minutes, 3 to 4  times a day and limiting salt in your diet helps lessen the problem.  Heartburn may develop as the uterus grows   and pushes up against the stomach. Antacids recommended by your caregiver helps with this problem. Also, eating smaller meals 4 to 5 times a day helps.  Constipation can be treated with a stool softener or adding bulk to your diet. Drinking lots of fluids, vegetables, fruits, and whole grains are helpful.  Exercising is also helpful. If you have been very active up until your pregnancy, most of these activities can be continued during your pregnancy. If you have been less active, it is helpful to start an exercise program such as walking.  Hemorrhoids (varicose veins in the rectum) may develop at the end of the second trimester. Warm sitz baths and hemorrhoid cream recommended by your caregiver helps hemorrhoid problems.  Backaches may develop during this time of your pregnancy. Avoid heavy lifting, wear low heal shoes and practice good posture to help with backache problems.  Some pregnant women develop tingling and numbness of their hand and fingers because of swelling and tightening of ligaments in the wrist (carpel tunnel syndrome). This goes away after the baby is born.  As your breasts enlarge, you may have to get a bigger bra. Get a comfortable, cotton, support bra. Do not get a nursing bra until the last month of the pregnancy if you will be nursing the baby.  You may get a dark line from your belly button to the pubic area called the linea nigra.  You may develop rosy cheeks because of increase blood flow to the face.  You may develop spider looking lines of the face, neck, arms and chest. These go away after the baby is born. HOME CARE INSTRUCTIONS   It is extremely important to avoid all smoking, herbs, alcohol, and unprescribed drugs during your pregnancy. These chemicals affect the formation and growth of the baby. Avoid these chemicals throughout the pregnancy to ensure  the delivery of a healthy infant.  Most of your home care instructions are the same as suggested for the first trimester of your pregnancy. Keep your caregiver's appointments. Follow your caregiver's instructions regarding medication use, exercise and diet.  During pregnancy, you are providing food for you and your baby. Continue to eat regular, well-balanced meals. Choose foods such as meat, fish, milk and other low fat dairy products, vegetables, fruits, and whole-grain breads and cereals. Your caregiver will tell you of the ideal weight gain.  A physical sexual relationship may be continued up until near the end of pregnancy if there are no other problems. Problems could include early (premature) leaking of amniotic fluid from the membranes, vaginal bleeding, abdominal pain, or other medical or pregnancy problems.  Exercise regularly if there are no restrictions. Check with your caregiver if you are unsure of the safety of some of your exercises. The greatest weight gain will occur in the last 2 trimesters of pregnancy. Exercise will help you:  Control your weight.  Get you in shape for labor and delivery.  Lose weight after you have the baby.  Wear a good support or jogging bra for breast tenderness during pregnancy. This may help if worn during sleep. Pads or tissues may be used in the bra if you are leaking colostrum.  Do not use hot tubs, steam rooms or saunas throughout the pregnancy.  Wear your seat belt at all times when driving. This protects you and your baby if you are in an accident.  Avoid raw meat, uncooked cheese, cat litter boxes and soil used by cats. These carry germs that can cause birth defects   in the baby.  The second trimester is also a good time to visit your dentist for your dental health if this has not been done yet. Getting your teeth cleaned is OK. Use a soft toothbrush. Brush gently during pregnancy.  It is easier to loose urine during pregnancy. Tightening up  and strengthening the pelvic muscles will help with this problem. Practice stopping your urination while you are going to the bathroom. These are the same muscles you need to strengthen. It is also the muscles you would use as if you were trying to stop from passing gas. You can practice tightening these muscles up 10 times a set and repeating this about 3 times per day. Once you know what muscles to tighten up, do not perform these exercises during urination. It is more likely to contribute to an infection by backing up the urine.  Ask for help if you have financial, counseling or nutritional needs during pregnancy. Your caregiver will be able to offer counseling for these needs as well as refer you for other special needs.  Your skin may become oily. If so, wash your face with mild soap, use non-greasy moisturizer and oil or cream based makeup. MEDICATIONS AND DRUG USE IN PREGNANCY  Take prenatal vitamins as directed. The vitamin should contain 1 milligram of folic acid. Keep all vitamins out of reach of children. Only a couple vitamins or tablets containing iron may be fatal to a baby or young child when ingested.  Avoid use of all medications, including herbs, over-the-counter medications, not prescribed or suggested by your caregiver. Only take over-the-counter or prescription medicines for pain, discomfort, or fever as directed by your caregiver. Do not use aspirin.  Let your caregiver also know about herbs you may be using.  Alcohol is related to a number of birth defects. This includes fetal alcohol syndrome. All alcohol, in any form, should be avoided completely. Smoking will cause low birth rate and premature babies.  Street or illegal drugs are very harmful to the baby. They are absolutely forbidden. A baby born to an addicted mother will be addicted at birth. The baby will go through the same withdrawal an adult does. SEEK MEDICAL CARE IF:  You have any concerns or worries during your  pregnancy. It is better to call with your questions if you feel they cannot wait, rather than worry about them. SEEK IMMEDIATE MEDICAL CARE IF:   An unexplained oral temperature above 102 F (38.9 C) develops, or as your caregiver suggests.  You have leaking of fluid from the vagina (birth canal). If leaking membranes are suspected, take your temperature and tell your caregiver of this when you call.  There is vaginal spotting, bleeding, or passing clots. Tell your caregiver of the amount and how many pads are used. Light spotting in pregnancy is common, especially following intercourse.  You develop a bad smelling vaginal discharge with a change in the color from clear to white.  You continue to feel sick to your stomach (nauseated) and have no relief from remedies suggested. You vomit blood or coffee ground-like materials.  You lose more than 2 pounds of weight or gain more than 2 pounds of weight over 1 week, or as suggested by your caregiver.  You notice swelling of your face, hands, feet, or legs.  You get exposed to German measles and have never had them.  You are exposed to fifth disease or chickenpox.  You develop belly (abdominal) pain. Round ligament discomfort is a common   non-cancerous (benign) cause of abdominal pain in pregnancy. Your caregiver still must evaluate you.  You develop a bad headache that does not go away.  You develop fever, diarrhea, pain with urination, or shortness of breath.  You develop visual problems, blurry, or double vision.  You fall or are in a car accident or any kind of trauma.  There is mental or physical violence at home. Document Released: 02/06/2001 Document Revised: 05/07/2011 Document Reviewed: 08/11/2008 [employer] Patient Information 2013 [employer].  

## 2012-05-01 NOTE — Progress Notes (Signed)
P - 75 - Pt states has had lower back pain 2/10 pain scale

## 2012-05-01 NOTE — Progress Notes (Signed)
   Subjective:    [name] [name] is a [id number] [redacted]w[redacted]d being seen today for her first obstetrical visit.  Her obstetrical history is significant for advanced maternal age and three prior term SVDs. Patient does intend to breast feed. Pregnancy history fully reviewed.  Patient reports backache, no urinary or other symptoms.  Pain is in lower back in midline, alleviated with positional change.  Takes Tylenol as needed.  Filed Vitals:   05/01/12 0844  BP: 120/76  Weight: 158 lb (71.668 kg)    HISTORY: OB History   Grav Para Term Preterm Abortions TAB SAB Ect Mult Living   4 3 3       3      # Outc Date GA Lbr Len/2nd Wgt Sex Del Anes PTL Lv   1 TRM 2004    F SVD   Yes   2 TRM 2005    F SVD   Yes   3 TRM 2008    M SVD   Yes   4 CUR              No past medical history on file. No past surgical history on file. Family History  Problem Relation Age of Onset  . Stroke Father      Exam  BP 120/76  Wt 158 lb (71.668 kg)  BMI 27.11 kg/m2  LMP 02/02/2012 Patient had a full physical exam in 03/13/2012, normal pap and negative HPV also at that visit.  Declines examination today.   Assessment:    Pregnancy: [id number] Patient Active Problem List  Diagnosis  . AMA (advanced maternal age) multigravida 35+   Plan:   Initial labs reviewed, all normal Continue prenatal vitamins, Tylenol as needed for back pain. No evidence of UTI on urine dip today. Problem list reviewed and updated. Genetic Screening discussed First Screen: ordered. Also discussed [facility]. Ultrasound discussed; fetal survey: to be ordered later. Follow up in 4 weeks.  ANYANWU,UGONNA A 05/01/2012

## 2012-05-27 ENCOUNTER — Ambulatory Visit (INDEPENDENT_AMBULATORY_CARE_PROVIDER_SITE_OTHER): Payer: BC Managed Care – PPO | Admitting: Family Medicine

## 2012-05-27 VITALS — BP 118/73 | Wt 161.2 lb

## 2012-05-27 DIAGNOSIS — O09522 Supervision of elderly multigravida, second trimester: Secondary | ICD-10-CM

## 2012-05-27 DIAGNOSIS — Z3480 Encounter for supervision of other normal pregnancy, unspecified trimester: Secondary | ICD-10-CM

## 2012-05-27 DIAGNOSIS — O09529 Supervision of elderly multigravida, unspecified trimester: Secondary | ICD-10-CM

## 2012-05-27 NOTE — Patient Instructions (Signed)
Pregnancy - Second Trimester The second trimester of pregnancy (3 to 6 months) is a period of rapid growth for you and your baby. At the end of the sixth month, your baby is about 9 inches long and weighs 1 1/2 pounds. You will begin to feel the baby move between 18 and 20 weeks of the pregnancy. This is called quickening. Weight gain is faster. A clear fluid (colostrum) may leak out of your breasts. You may feel small contractions of the womb (uterus). This is known as false labor or Braxton-Hicks contractions. This is like a practice for labor when the baby is ready to be born. Usually, the problems with morning sickness have usually passed by the end of your first trimester. Some women develop small dark blotches (called cholasma, mask of pregnancy) on their face that usually goes away after the baby is born. Exposure to the sun makes the blotches worse. Acne may also develop in some pregnant women and pregnant women who have acne, may find that it goes away. PRENATAL EXAMS  Blood work may continue to be done during prenatal exams. These tests are done to check on your health and the probable health of your baby. Blood work is used to follow your blood levels (hemoglobin). Anemia (low hemoglobin) is common during pregnancy. Iron and vitamins are given to help prevent this. You will also be checked for diabetes between 24 and 28 weeks of the pregnancy. Some of the previous blood tests may be repeated.  The size of the uterus is measured during each visit. This is to make sure that the baby is continuing to grow properly according to the dates of the pregnancy.  Your blood pressure is checked every prenatal visit. This is to make sure you are not getting toxemia.  Your urine is checked to make sure you do not have an infection, diabetes or protein in the urine.  Your weight is checked often to make sure gains are happening at the suggested rate. This is to ensure that both you and your baby are  growing normally.  Sometimes, an ultrasound is performed to confirm the proper growth and development of the baby. This is a test which bounces harmless sound waves off the baby so your caregiver can more accurately determine due dates. Sometimes, a specialized test is done on the amniotic fluid surrounding the baby. This test is called an amniocentesis. The amniotic fluid is obtained by sticking a needle into the belly (abdomen). This is done to check the chromosomes in instances where there is a concern about possible genetic problems with the baby. It is also sometimes done near the end of pregnancy if an early delivery is required. In this case, it is done to help make sure the baby's lungs are mature enough for the baby to live outside of the womb. CHANGES OCCURING IN THE SECOND TRIMESTER OF PREGNANCY Your body goes through many changes during pregnancy. They vary from person to person. Talk to your caregiver about changes you notice that you are concerned about.  During the second trimester, you will likely have an increase in your appetite. It is normal to have cravings for certain foods. This varies from person to person and pregnancy to pregnancy.  Your lower abdomen will begin to bulge.  You may have to urinate more often because the uterus and baby are pressing on your bladder. It is also common to get more bladder infections during pregnancy (pain with urination). You can help this by   drinking lots of fluids and emptying your bladder before and after intercourse.  You may begin to get stretch marks on your hips, abdomen, and breasts. These are normal changes in the body during pregnancy. There are no exercises or medications to take that prevent this change.  You may begin to develop swollen and bulging veins (varicose veins) in your legs. Wearing support hose, elevating your feet for 15 minutes, 3 to 4 times a day and limiting salt in your diet helps lessen the problem.  Heartburn may  develop as the uterus grows and pushes up against the stomach. Antacids recommended by your caregiver helps with this problem. Also, eating smaller meals 4 to 5 times a day helps.  Constipation can be treated with a stool softener or adding bulk to your diet. Drinking lots of fluids, vegetables, fruits, and whole grains are helpful.  Exercising is also helpful. If you have been very active up until your pregnancy, most of these activities can be continued during your pregnancy. If you have been less active, it is helpful to start an exercise program such as walking.  Hemorrhoids (varicose veins in the rectum) may develop at the end of the second trimester. Warm sitz baths and hemorrhoid cream recommended by your caregiver helps hemorrhoid problems.  Backaches may develop during this time of your pregnancy. Avoid heavy lifting, wear low heal shoes and practice good posture to help with backache problems.  Some pregnant women develop tingling and numbness of their hand and fingers because of swelling and tightening of ligaments in the wrist (carpel tunnel syndrome). This goes away after the baby is born.  As your breasts enlarge, you may have to get a bigger bra. Get a comfortable, cotton, support bra. Do not get a nursing bra until the last month of the pregnancy if you will be nursing the baby.  You may get a dark line from your belly button to the pubic area called the linea nigra.  You may develop rosy cheeks because of increase blood flow to the face.  You may develop spider looking lines of the face, neck, arms and chest. These go away after the baby is born. HOME CARE INSTRUCTIONS   It is extremely important to avoid all smoking, herbs, alcohol, and unprescribed drugs during your pregnancy. These chemicals affect the formation and growth of the baby. Avoid these chemicals throughout the pregnancy to ensure the delivery of a healthy infant.  Most of your home care instructions are the same  as suggested for the first trimester of your pregnancy. Keep your caregiver's appointments. Follow your caregiver's instructions regarding medication use, exercise and diet.  During pregnancy, you are providing food for you and your baby. Continue to eat regular, well-balanced meals. Choose foods such as meat, fish, milk and other low fat dairy products, vegetables, fruits, and whole-grain breads and cereals. Your caregiver will tell you of the ideal weight gain.  A physical sexual relationship may be continued up until near the end of pregnancy if there are no other problems. Problems could include early (premature) leaking of amniotic fluid from the membranes, vaginal bleeding, abdominal pain, or other medical or pregnancy problems.  Exercise regularly if there are no restrictions. Check with your caregiver if you are unsure of the safety of some of your exercises. The greatest weight gain will occur in the last 2 trimesters of pregnancy. Exercise will help you:  Control your weight.  Get you in shape for labor and delivery.  Lose weight   after you have the baby.  Wear a good support or jogging bra for breast tenderness during pregnancy. This may help if worn during sleep. Pads or tissues may be used in the bra if you are leaking colostrum.  Do not use hot tubs, steam rooms or saunas throughout the pregnancy.  Wear your seat belt at all times when driving. This protects you and your baby if you are in an accident.  Avoid raw meat, uncooked cheese, cat litter boxes and soil used by cats. These carry germs that can cause birth defects in the baby.  The second trimester is also a good time to visit your dentist for your dental health if this has not been done yet. Getting your teeth cleaned is OK. Use a soft toothbrush. Brush gently during pregnancy.  It is easier to loose urine during pregnancy. Tightening up and strengthening the pelvic muscles will help with this problem. Practice stopping  your urination while you are going to the bathroom. These are the same muscles you need to strengthen. It is also the muscles you would use as if you were trying to stop from passing gas. You can practice tightening these muscles up 10 times a set and repeating this about 3 times per day. Once you know what muscles to tighten up, do not perform these exercises during urination. It is more likely to contribute to an infection by backing up the urine.  Ask for help if you have financial, counseling or nutritional needs during pregnancy. Your caregiver will be able to offer counseling for these needs as well as refer you for other special needs.  Your skin may become oily. If so, wash your face with mild soap, use non-greasy moisturizer and oil or cream based makeup. MEDICATIONS AND DRUG USE IN PREGNANCY  Take prenatal vitamins as directed. The vitamin should contain 1 milligram of folic acid. Keep all vitamins out of reach of children. Only a couple vitamins or tablets containing iron may be fatal to a baby or young child when ingested.  Avoid use of all medications, including herbs, over-the-counter medications, not prescribed or suggested by your caregiver. Only take over-the-counter or prescription medicines for pain, discomfort, or fever as directed by your caregiver. Do not use aspirin.  Let your caregiver also know about herbs you may be using.  Alcohol is related to a number of birth defects. This includes fetal alcohol syndrome. All alcohol, in any form, should be avoided completely. Smoking will cause low birth rate and premature babies.  Street or illegal drugs are very harmful to the baby. They are absolutely forbidden. A baby born to an addicted mother will be addicted at birth. The baby will go through the same withdrawal an adult does. SEEK MEDICAL CARE IF:  You have any concerns or worries during your pregnancy. It is better to call with your questions if you feel they cannot wait,  rather than worry about them. SEEK IMMEDIATE MEDICAL CARE IF:   An unexplained oral temperature above 102 F (38.9 C) develops, or as your caregiver suggests.  You have leaking of fluid from the vagina (birth canal). If leaking membranes are suspected, take your temperature and tell your caregiver of this when you call.  There is vaginal spotting, bleeding, or passing clots. Tell your caregiver of the amount and how many pads are used. Light spotting in pregnancy is common, especially following intercourse.  You develop a bad smelling vaginal discharge with a change in the color from clear   to white.  You continue to feel sick to your stomach (nauseated) and have no relief from remedies suggested. You vomit blood or coffee ground-like materials.  You lose more than 2 pounds of weight or gain more than 2 pounds of weight over 1 week, or as suggested by your caregiver.  You notice swelling of your face, hands, feet, or legs.  You get exposed to German measles and have never had them.  You are exposed to fifth disease or chickenpox.  You develop belly (abdominal) pain. Round ligament discomfort is a common non-cancerous (benign) cause of abdominal pain in pregnancy. Your caregiver still must evaluate you.  You develop a bad headache that does not go away.  You develop fever, diarrhea, pain with urination, or shortness of breath.  You develop visual problems, blurry, or double vision.  You fall or are in a car accident or any kind of trauma.  There is mental or physical violence at home. Document Released: 02/06/2001 Document Revised: 05/07/2011 Document Reviewed: 08/11/2008 [employer] Patient Information 2013 [employer].  Breastfeeding Deciding to breastfeed is one of the best choices you can make for you and your baby. The information that follows gives a brief overview of the benefits of breastfeeding as well as common topics surrounding breastfeeding. BENEFITS OF  BREASTFEEDING For the baby  The first milk (colostrum) helps the baby's digestive system function better.   There are antibodies in the mother's milk that help the baby fight off infections.   The baby has a lower incidence of asthma, allergies, and sudden infant death syndrome (SIDS).   The nutrients in breast milk are better for the baby than infant formulas, and breast milk helps the baby's brain grow better.   Babies who breastfeed have less gas, colic, and constipation.  For the mother  Breastfeeding helps develop a very special bond between the mother and her baby.   Breastfeeding is convenient, always available at the correct temperature, and costs nothing.   Breastfeeding burns calories in the mother and helps her lose weight that was gained during pregnancy.   Breastfeeding makes the uterus contract back down to normal size faster and slows bleeding following delivery.   Breastfeeding mothers have a lower risk of developing breast cancer.  BREASTFEEDING FREQUENCY  A healthy, full-term baby may breastfeed as often as every hour or space his or her feedings to every 3 hours.   Watch your baby for signs of hunger. Nurse your baby if he or she shows signs of hunger. How often you nurse will vary from baby to baby.   Nurse as often as the baby requests, or when you feel the need to reduce the fullness of your breasts.   Awaken the baby if it has been 3 4 hours since the last feeding.   Frequent feeding will help the mother make more milk and will help prevent problems, such as sore nipples and engorgement of the breasts.  BABY'S POSITION AT THE BREAST  Whether lying down or sitting, be sure that the baby's tummy is facing your tummy.   Support the breast with 4 fingers underneath the breast and the thumb above. Make sure your fingers are well away from the nipple and baby's mouth.   Stroke the baby's lips gently with your finger or nipple.   When the  baby's mouth is open wide enough, place all of your nipple and as much of the areola as possible into your baby's mouth.   Pull the baby in   close so the tip of the nose and the baby's cheeks touch the breast during the feeding.  FEEDINGS AND SUCTION  The length of each feeding varies from baby to baby and from feeding to feeding.   The baby must suck about 2 3 minutes for your milk to get to him or her. This is called a "let down." For this reason, allow the baby to feed on each breast as long as he or she wants. Your baby will end the feeding when he or she has received the right balance of nutrients.   To break the suction, put your finger into the corner of the baby's mouth and slide it between his or her gums before removing your breast from his or her mouth. This will help prevent sore nipples.  HOW TO TELL WHETHER YOUR BABY IS GETTING ENOUGH BREAST MILK. Wondering whether or not your baby is getting enough milk is a common concern among mothers. You can be assured that your baby is getting enough milk if:   Your baby is actively sucking and you hear swallowing.   Your baby seems relaxed and satisfied after a feeding.   Your baby nurses at least 8 12 times in a 24 hour time period. Nurse your baby until he or she unlatches or falls asleep at the first breast (at least 10 20 minutes), then offer the second side.   Your baby is wetting 5 6 disposable diapers (6 8 cloth diapers) in a 24 hour period by 5 6 days of age.   Your baby is having at least 3 4 stools every 24 hours for the first 6 weeks. The stool should be soft and yellow.   Your baby should gain 4 7 ounces per week after he or she is 4 days old.   Your breasts feel softer after nursing.  REDUCING BREAST ENGORGEMENT  In the first week after your baby is born, you may experience signs of breast engorgement. When breasts are engorged, they feel heavy, warm, full, and may be tender to the touch. You can reduce  engorgement if you:   Nurse frequently, every 2 3 hours. Mothers who breastfeed early and often have fewer problems with engorgement.   Place light ice packs on your breasts for 10 20 minutes between feedings. This reduces swelling. Wrap the ice packs in a lightweight towel to protect your skin. Bags of frozen vegetables work well for this purpose.   Take a warm shower or apply warm, moist heat to your breast for 5 10 minutes just before each feeding. This increases circulation and helps the milk flow.   Gently massage your breast before and during the feeding. Using your finger tips, massage from the chest wall towards your nipple in a circular motion.   Make sure that the baby empties at least one breast at every feeding before switching sides.   Use a breast pump to empty the breasts if your baby is sleepy or not nursing well. You may also want to pump if you are returning to work oryou feel you are getting engorged.   Avoid bottle feeds, pacifiers, or supplemental feedings of water or juice in place of breastfeeding. Breast milk is all the food your baby needs. It is not necessary for your baby to have water or formula. In fact, to help your breasts make more milk, it is best not to give your baby supplemental feedings during the early weeks.   Be sure the baby is latched   on and positioned properly while breastfeeding.   Wear a supportive bra, avoiding underwire styles.   Eat a balanced diet with enough fluids.   Rest often, relax, and take your prenatal vitamins to prevent fatigue, stress, and anemia.  If you follow these suggestions, your engorgement should improve in 24 48 hours. If you are still experiencing difficulty, call your lactation consultant or caregiver.  CARING FOR YOURSELF Take care of your breasts  Bathe or shower daily.   Avoid using soap on your nipples.   Start feedings on your left breast at one feeding and on your right breast at the next  feeding.   You will notice an increase in your milk supply 2 5 days after delivery. You may feel some discomfort from engorgement, which makes your breasts very firm and often tender. Engorgement "peaks" out within 24 48 hours. In the meantime, apply warm moist towels to your breasts for 5 10 minutes before feeding. Gentle massage and expression of some milk before feeding will soften your breasts, making it easier for your baby to latch on.   Wear a well-fitting nursing bra, and air dry your nipples for a 3 4minutes after each feeding.   Only use cotton bra pads.   Only use pure lanolin on your nipples after nursing. You do not need to wash it off before feeding the baby again. Another option is to express a few drops of breast milk and gently massage it into your nipples.  Take care of yourself  Eat well-balanced meals and nutritious snacks.   Drinking milk, fruit juice, and water to satisfy your thirst (about 8 glasses a day).   Get plenty of rest.  Avoid foods that you notice affect the baby in a bad way.  SEEK MEDICAL CARE IF:   You have difficulty with breastfeeding and need help.   You have a hard, red, sore area on your breast that is accompanied by a fever.   Your baby is too sleepy to eat well or is having trouble sleeping.   Your baby is wetting less than 6 diapers a day, by 5 days of age.   Your baby's skin or white part of his or her eyes is more yellow than it was in the hospital.   You feel depressed.  Document Released: 02/12/2005 Document Revised: 08/14/2011 Document Reviewed: 05/13/2011 [employer] Patient Information 2013 [employer].  

## 2012-05-27 NOTE — Progress Notes (Signed)
Doing well.  Did not have first screen.  Will refer to MFM for detail and genetics.  Pt. May be interested in [place].

## 2012-06-09 ENCOUNTER — Encounter (HOSPITAL_COMMUNITY): Payer: Self-pay | Admitting: Family Medicine

## 2012-06-09 ENCOUNTER — Other Ambulatory Visit: Payer: Self-pay | Admitting: Obstetrics and Gynecology

## 2012-06-09 DIAGNOSIS — Z3689 Encounter for other specified antenatal screening: Secondary | ICD-10-CM

## 2012-06-09 DIAGNOSIS — O09522 Supervision of elderly multigravida, second trimester: Secondary | ICD-10-CM

## 2012-06-17 ENCOUNTER — Ambulatory Visit (HOSPITAL_COMMUNITY): Payer: BC Managed Care – PPO

## 2012-06-17 ENCOUNTER — Ambulatory Visit (HOSPITAL_COMMUNITY)
Admission: RE | Admit: 2012-06-17 | Discharge: 2012-06-17 | Disposition: A | Discharge status: Home / Self Care | Payer: BC Managed Care – PPO | Source: Ambulatory Visit | Attending: Family Medicine | Admitting: Family Medicine

## 2012-06-17 VITALS — BP 114/70 | HR 95 | Wt 164.0 lb

## 2012-06-17 DIAGNOSIS — Z3689 Encounter for other specified antenatal screening: Secondary | ICD-10-CM

## 2012-06-17 DIAGNOSIS — Z1389 Encounter for screening for other disorder: Secondary | ICD-10-CM | POA: Insufficient documentation

## 2012-06-17 DIAGNOSIS — Z363 Encounter for antenatal screening for malformations: Secondary | ICD-10-CM | POA: Insufficient documentation

## 2012-06-17 DIAGNOSIS — O09529 Supervision of elderly multigravida, unspecified trimester: Secondary | ICD-10-CM | POA: Insufficient documentation

## 2012-06-17 DIAGNOSIS — O09522 Supervision of elderly multigravida, second trimester: Secondary | ICD-10-CM

## 2012-06-17 DIAGNOSIS — O358XX0 Maternal care for other (suspected) fetal abnormality and damage, not applicable or unspecified: Secondary | ICD-10-CM | POA: Insufficient documentation

## 2012-06-17 NOTE — Progress Notes (Signed)
[name] [name]  was seen today for an ultrasound appointment.  See full report in [facility].  Comments: [name] [name] was seen today due to advanced maternal age.  She did not undergo first trimester screening.  She was quoted an apriori risk of 1:168 based on maternal age.  Ultrasound today reveals a thickened nuchal fold (7-8 mm).  The remainder of the fetal anatomy is within normal limits.  No other markers associated with aneuploidy were noted.  A thickenend nuchal fold is associated with a Likelihood ratio of 17 which would increase her risk for Down syndrome to aproximately 1:10.  The findings and limitations of the ultrasound were discussed with the patient.  She was offerred both amniocentesis as well as NIPT (cell free fetal DNA).  After  counseling, she declined any additional testing.  Impression: Single IUP at 19 3/7 weeks Thickened nuchal fold (7-8 mm) Otherwise normal fetal anatomic survey No other markers associated with Down syndrome were noted Normal amniotic fluid volume  Recommendations: The patient will return for formal Genetic counseling.  If she continues to decline further genetic testing, would entertain fetal echo with Peds Cardiology. Recommend follow-up ultrasound examination in 4 weeks to reevaluate.  Alpha Gula, MD

## 2012-06-18 ENCOUNTER — Ambulatory Visit (HOSPITAL_COMMUNITY)
Admission: RE | Admit: 2012-06-18 | Discharge: 2012-06-18 | Disposition: A | Discharge status: Home / Self Care | Payer: BC Managed Care – PPO | Source: Ambulatory Visit | Attending: Obstetrics & Gynecology | Admitting: Obstetrics & Gynecology

## 2012-06-18 NOTE — Progress Notes (Signed)
Genetic Counseling  High-Risk Gestation Note  Appointment Date:  06/18/2012 Referred By: Lesly Dukes, MD Date of Birth:  [birth year] Partner:  Judie Grieve   Pregnancy History: [id number] Estimated Date of Delivery: 11/08/12 Estimated Gestational Age: [redacted]w[redacted]d Attending: Particia Nearing, MD  [name] was seen for genetic counseling because of a maternal age of 56 and the ultrasound finding of an increased nuchal fold.  She was counseled regarding maternal age and the association with risk for chromosome conditions due to nondisjunction with aging of the ova.   We reviewed chromosomes, nondisjunction, and the associated 1 in 93 risk for fetal aneuploidy related to a maternal age of 83 at [redacted]w[redacted]d gestation.  She was counseled that the risk for aneuploidy decreases as gestational age increases, accounting for those pregnancies which spontaneously abort.  We specifically discussed Down syndrome (trisomy 41), trisomies 86 and 41, and sex chromosome aneuploidies (47,XXX and 47,XXY) including the common features and prognoses of each.   The patient had a detailed anatomy ultrasound yesterday at [facility] [place].  The report will be documented separately. We discussed that the second trimester genetic sonogram is targeted at identifying features associated with aneuploidy.  It has evolved as a screening tool used to provide an individualized risk assessment for Down syndrome and other trisomies.  The ability of sonography to aid in the detection of aneuploidies relies on identification of both major structural anomalies and "soft markers."  The patient was counseled that the latter term refers to findings that are often normal variants and do not cause any significant medical problems.  Nonetheless, these markers have a known association with aneuploidy.  Ultrasound today revealed an increased nuchal fold of ~7-8 mm.    We discussed this finding and its  significance in detail. Specifically, [name] was counseled that the nuchal fold (NF) refers to the skin thickness in the posterior aspect of the fetal neck. This area is traditionally measurable between 15 and 19.[redacted] weeks gestation and is considered enlarged ?6 mm.  [name] was counseled regarding the various common etiologies for an enlarged NF including: aneuploidy, other chromosome aberrations, single gene conditions, cardiac or great vessel abnormalities, and normal variation.    Considering [name]'s maternal age of 15 and the ultrasound finding of an increased NF (LR =~12-17), the risk for fetal aneuploidy, specifically Down syndrome, is estimated to be ~1 in 10 or 10%.  In addition, we discussed the risk for other chromosome aberrations including microdeletions, duplications, insertions, and translocations.  She was then counseled regarding the risk for single gene conditions.  We discussed that these conditions are not routinely tested for prenatally unless ultrasound findings or family history significantly increase the suspicion of a specific single gene disorder. We briefly reviewed common inheritance patterns (dominant, recessive, and X-linked) as well as the associated risks of recurrence.   She was then counseled that there is a known correlation between fetal heart defects and increased NF measurements.  Although this data is less clear than the association found between abnormal nuchal translucency measurements and fetal heart defects, the finding of an increased NF warrants further cardiac investigation/fetal echocardiogram.   We discussed the options of Quad screening, noninvasive prenatal screening/cell free fetal DNA testing (NIPS/cffDNA), amniocentesis for both fetal karyotype and microarray analysis, serial sonography, and fetal echocardiogram. We reviewed the benefits, limitations, and risks of these options. After thoughtful consideration, [name] declined screening and diagnostic  testing for fetal aneuploidy.  She elected to return for a follow up ultrasound in 4 weeks and will decide whether or not she would like a fetal echocardiogram at that time.  She was counseled that the fetal prognosis depends on the underlying etiology of the ultrasound finding and further anticipatory guidance can be provided if a diagnosis is discovered.  We discussed the availability of a postnatal consult with a pediatric medical geneticist to help determine the etiology, if warranted.   Considering [name]'s Asian ancestry, we discussed the increased incidence of alpha-thalassemia and specific hemoglobinopathies in the Asian population. We discussed autosomal recessive inheritance, the availability of carrier testing, and the option of prenatal diagnosis for alpha-thalassemia if needed.  We also discussed that MCV values below 80 may indicate that an individual is a carrier of a microcytic anemia. If the MCV value is within the normal range, hemoglobin electrophoresis can further reduce the risk of being a carrier.     Both family histories were reviewed and found to be noncontributory for birth defects, mental retardation, and known genetic conditions. Without further information regarding the provided family history, an accurate genetic risk cannot be calculated. Further genetic counseling is warranted if more information is obtained.  [name] denied exposure to environmental toxins or chemical agents. She denied the use of alcohol, tobacco or street drugs. She denied significant viral illnesses during the course of her pregnancy.   I counseled [name] regarding the above risks and available options.  The approximate face-to-face time with the genetic counselor was 38 minutes.  Donald Prose, MS  Certified Genetic Counselor

## 2012-06-19 ENCOUNTER — Encounter: Payer: Self-pay | Admitting: Obstetrics and Gynecology

## 2012-06-25 ENCOUNTER — Encounter: Payer: Self-pay | Admitting: Obstetrics and Gynecology

## 2012-06-25 ENCOUNTER — Ambulatory Visit (INDEPENDENT_AMBULATORY_CARE_PROVIDER_SITE_OTHER): Payer: BC Managed Care – PPO | Admitting: Obstetrics and Gynecology

## 2012-06-25 VITALS — BP 123/67 | Wt 166.0 lb

## 2012-06-25 DIAGNOSIS — O09529 Supervision of elderly multigravida, unspecified trimester: Secondary | ICD-10-CM

## 2012-06-25 DIAGNOSIS — Z3480 Encounter for supervision of other normal pregnancy, unspecified trimester: Secondary | ICD-10-CM

## 2012-06-25 DIAGNOSIS — O09522 Supervision of elderly multigravida, second trimester: Secondary | ICD-10-CM

## 2012-06-25 NOTE — Progress Notes (Signed)
[id number]  

## 2012-06-25 NOTE — Progress Notes (Signed)
Patient doing well without complaints. Reviewed anatomy ultrasound findings. Patient is not interested in further testing. Follow-up MFM ultrasound scheduled for 5/21. Patient plans to breast feed and is interested in Paraguard IUD for birth control

## 2012-07-16 ENCOUNTER — Ambulatory Visit (HOSPITAL_COMMUNITY): Payer: BC Managed Care – PPO

## 2012-07-22 ENCOUNTER — Ambulatory Visit (HOSPITAL_COMMUNITY): Admission: RE | Admit: 2012-07-22 | Payer: BC Managed Care – PPO | Source: Ambulatory Visit

## 2012-07-29 ENCOUNTER — Encounter: Payer: Self-pay | Admitting: Family Medicine

## 2012-07-29 ENCOUNTER — Ambulatory Visit (INDEPENDENT_AMBULATORY_CARE_PROVIDER_SITE_OTHER): Payer: BC Managed Care – PPO | Admitting: Family Medicine

## 2012-07-29 VITALS — BP 130/72 | Wt 171.0 lb

## 2012-07-29 DIAGNOSIS — Z3480 Encounter for supervision of other normal pregnancy, unspecified trimester: Secondary | ICD-10-CM

## 2012-07-29 DIAGNOSIS — O09522 Supervision of elderly multigravida, second trimester: Secondary | ICD-10-CM

## 2012-07-29 DIAGNOSIS — O09529 Supervision of elderly multigravida, unspecified trimester: Secondary | ICD-10-CM

## 2012-07-29 NOTE — Progress Notes (Signed)
Having some back pain

## 2012-07-29 NOTE — Patient Instructions (Signed)
Pregnancy - Second Trimester The second trimester of pregnancy (3 to 6 months) is a period of rapid growth for you and your baby. At the end of the sixth month, your baby is about 9 inches long and weighs 1 1/2 pounds. You will begin to feel the baby move between 18 and 20 weeks of the pregnancy. This is called quickening. Weight gain is faster. A clear fluid (colostrum) may leak out of your breasts. You may feel small contractions of the womb (uterus). This is known as false labor or Braxton-Hicks contractions. This is like a practice for labor when the baby is ready to be born. Usually, the problems with morning sickness have usually passed by the end of your first trimester. Some women develop small dark blotches (called cholasma, mask of pregnancy) on their face that usually goes away after the baby is born. Exposure to the sun makes the blotches worse. Acne may also develop in some pregnant women and pregnant women who have acne, may find that it goes away. PRENATAL EXAMS  Blood work may continue to be done during prenatal exams. These tests are done to check on your health and the probable health of your baby. Blood work is used to follow your blood levels (hemoglobin). Anemia (low hemoglobin) is common during pregnancy. Iron and vitamins are given to help prevent this. You will also be checked for diabetes between 24 and 28 weeks of the pregnancy. Some of the previous blood tests may be repeated.  The size of the uterus is measured during each visit. This is to make sure that the baby is continuing to grow properly according to the dates of the pregnancy.  Your blood pressure is checked every prenatal visit. This is to make sure you are not getting toxemia.  Your urine is checked to make sure you do not have an infection, diabetes or protein in the urine.  Your weight is checked often to make sure gains are happening at the suggested rate. This is to ensure that both you and your baby are  growing normally.  Sometimes, an ultrasound is performed to confirm the proper growth and development of the baby. This is a test which bounces harmless sound waves off the baby so your caregiver can more accurately determine due dates. Sometimes, a test is done on the amniotic fluid surrounding the baby. This test is called an amniocentesis. The amniotic fluid is obtained by sticking a needle into the belly (abdomen). This is done to check the chromosomes in instances where there is a concern about possible genetic problems with the baby. It is also sometimes done near the end of pregnancy if an early delivery is required. In this case, it is done to help make sure the baby's lungs are mature enough for the baby to live outside of the womb. CHANGES OCCURING IN THE SECOND TRIMESTER OF PREGNANCY Your body goes through many changes during pregnancy. They vary from person to person. Talk to your caregiver about changes you notice that you are concerned about.  During the second trimester, you will likely have an increase in your appetite. It is normal to have cravings for certain foods. This varies from person to person and pregnancy to pregnancy.  Your lower abdomen will begin to bulge.  You may have to urinate more often because the uterus and baby are pressing on your bladder. It is also common to get more bladder infections during pregnancy. You can help this by drinking lots of fluids   and emptying your bladder before and after intercourse.  You may begin to get stretch marks on your hips, abdomen, and breasts. These are normal changes in the body during pregnancy. There are no exercises or medicines to take that prevent this change.  You may begin to develop swollen and bulging veins (varicose veins) in your legs. Wearing support hose, elevating your feet for 15 minutes, 3 to 4 times a day and limiting salt in your diet helps lessen the problem.  Heartburn may develop as the uterus grows and  pushes up against the stomach. Antacids recommended by your caregiver helps with this problem. Also, eating smaller meals 4 to 5 times a day helps.  Constipation can be treated with a stool softener or adding bulk to your diet. Drinking lots of fluids, and eating vegetables, fruits, and whole grains are helpful.  Exercising is also helpful. If you have been very active up until your pregnancy, most of these activities can be continued during your pregnancy. If you have been less active, it is helpful to start an exercise program such as walking.  Hemorrhoids may develop at the end of the second trimester. Warm sitz baths and hemorrhoid cream recommended by your caregiver helps hemorrhoid problems.  Backaches may develop during this time of your pregnancy. Avoid heavy lifting, wear low heal shoes, and practice good posture to help with backache problems.  Some pregnant women develop tingling and numbness of their hand and fingers because of swelling and tightening of ligaments in the wrist (carpel tunnel syndrome). This goes away after the baby is born.  As your breasts enlarge, you may have to get a bigger bra. Get a comfortable, cotton, support bra. Do not get a nursing bra until the last month of the pregnancy if you will be nursing the baby.  You may get a dark line from your belly button to the pubic area called the linea nigra.  You may develop rosy cheeks because of increase blood flow to the face.  You may develop spider looking lines of the face, neck, arms, and chest. These go away after the baby is born. HOME CARE INSTRUCTIONS   It is extremely important to avoid all smoking, herbs, alcohol, and unprescribed drugs during your pregnancy. These chemicals affect the formation and growth of the baby. Avoid these chemicals throughout the pregnancy to ensure the delivery of a healthy infant.  Most of your home care instructions are the same as suggested for the first trimester of your  pregnancy. Keep your caregiver's appointments. Follow your caregiver's instructions regarding medicine use, exercise, and diet.  During pregnancy, you are providing food for you and your baby. Continue to eat regular, well-balanced meals. Choose foods such as meat, fish, milk and other low fat dairy products, vegetables, fruits, and whole-grain breads and cereals. Your caregiver will tell you of the ideal weight gain.  A physical sexual relationship may be continued up until near the end of pregnancy if there are no other problems. Problems could include early (premature) leaking of amniotic fluid from the membranes, vaginal bleeding, abdominal pain, or other medical or pregnancy problems.  Exercise regularly if there are no restrictions. Check with your caregiver if you are unsure of the safety of some of your exercises. The greatest weight gain will occur in the last 2 trimesters of pregnancy. Exercise will help you:  Control your weight.  Get you in shape for labor and delivery.  Lose weight after you have the baby.  Wear   a good support or jogging bra for breast tenderness during pregnancy. This may help if worn during sleep. Pads or tissues may be used in the bra if you are leaking colostrum.  Do not use hot tubs, steam rooms or saunas throughout the pregnancy.  Wear your seat belt at all times when driving. This protects you and your baby if you are in an accident.  Avoid raw meat, uncooked cheese, cat litter boxes, and soil used by cats. These carry germs that can cause birth defects in the baby.  The second trimester is also a good time to visit your dentist for your dental health if this has not been done yet. Getting your teeth cleaned is okay. Use a soft toothbrush. Brush gently during pregnancy.  It is easier to leak urine during pregnancy. Tightening up and strengthening the pelvic muscles will help with this problem. Practice stopping your urination while you are going to the  bathroom. These are the same muscles you need to strengthen. It is also the muscles you would use as if you were trying to stop from passing gas. You can practice tightening these muscles up 10 times a set and repeating this about 3 times per day. Once you know what muscles to tighten up, do not perform these exercises during urination. It is more likely to contribute to an infection by backing up the urine.  Ask for help if you have financial, counseling, or nutritional needs during pregnancy. Your caregiver will be able to offer counseling for these needs as well as refer you for other special needs.  Your skin may become oily. If so, wash your face with mild soap, use non-greasy moisturizer and oil or cream based makeup. MEDICINES AND DRUG USE IN PREGNANCY  Take prenatal vitamins as directed. The vitamin should contain 1 milligram of folic acid. Keep all vitamins out of reach of children. Only a couple vitamins or tablets containing iron may be fatal to a baby or young child when ingested.  Avoid use of all medicines, including herbs, over-the-counter medicines, not prescribed or suggested by your caregiver. Only take over-the-counter or prescription medicines for pain, discomfort, or fever as directed by your caregiver. Do not use aspirin.  Let your caregiver also know about herbs you may be using.  Alcohol is related to a number of birth defects. This includes fetal alcohol syndrome. All alcohol, in any form, should be avoided completely. Smoking will cause low birth rate and premature babies.  Street or illegal drugs are very harmful to the baby. They are absolutely forbidden. A baby born to an addicted mother will be addicted at birth. The baby will go through the same withdrawal an adult does. SEEK MEDICAL CARE IF:  You have any concerns or worries during your pregnancy. It is better to call with your questions if you feel they cannot wait, rather than worry about them. SEEK IMMEDIATE  MEDICAL CARE IF:   An unexplained oral temperature above 102 F (38.9 C) develops, or as your caregiver suggests.  You have leaking of fluid from the vagina (birth canal). If leaking membranes are suspected, take your temperature and tell your caregiver of this when you call.  There is vaginal spotting, bleeding, or passing clots. Tell your caregiver of the amount and how many pads are used. Light spotting in pregnancy is common, especially following intercourse.  You develop a bad smelling vaginal discharge with a change in the color from clear to white.  You continue to feel   sick to your stomach (nauseated) and have no relief from remedies suggested. You vomit blood or coffee ground-like materials.  You lose more than 2 pounds of weight or gain more than 2 pounds of weight over 1 week, or as suggested by your caregiver.  You notice swelling of your face, hands, feet, or legs.  You get exposed to German measles and have never had them.  You are exposed to fifth disease or chickenpox.  You develop belly (abdominal) pain. Round ligament discomfort is a common non-cancerous (benign) cause of abdominal pain in pregnancy. Your caregiver still must evaluate you.  You develop a bad headache that does not go away.  You develop fever, diarrhea, pain with urination, or shortness of breath.  You develop visual problems, blurry, or double vision.  You fall or are in a car accident or any kind of trauma.  There is mental or physical violence at home. Document Released: 02/06/2001 Document Revised: 11/07/2011 Document Reviewed: 08/11/2008 [employer] Patient Information 2014 [employer].  Breastfeeding A change in hormones during your pregnancy causes growth of your breast tissue and an increase in number and size of milk ducts. The hormone prolactin allows proteins, sugars, and fats from your blood supply to make breast milk in your milk-producing glands. The hormone progesterone prevents  breast milk from being released before the birth of your baby. After the birth of your baby, your progesterone level decreases allowing breast milk to be released. Thoughts of your baby, as well as his or her sucking or crying, can stimulate the release of milk from the milk-producing glands. Deciding to breastfeed (nurse) is one of the best choices you can make for you and your baby. The information that follows gives a brief review of the benefits, as well as other important skills to know about breastfeeding. BENEFITS OF BREASTFEEDING For your baby  The first milk (colostrum) helps your baby's digestive system function better.   There are antibodies in your milk that help your baby fight off infections.   Your baby has a lower incidence of asthma, allergies, and sudden infant death syndrome (SIDS).   The nutrients in breast milk are better for your baby than infant formulas.  Breast milk improves your baby's brain development.   Your baby will have less gas, colic, and constipation.  Your baby is less likely to develop other conditions, such as childhood obesity, asthma, or diabetes mellitus. For you  Breastfeeding helps develop a very special bond between you and your baby.   Breastfeeding is convenient, always available at the correct temperature, and costs nothing.   Breastfeeding helps to burn calories and helps you lose the weight gained during pregnancy.   Breastfeeding makes your uterus contract back down to normal size faster and slows bleeding following delivery.   Breastfeeding mothers have a lower risk of developing osteoporosis or breast or ovarian cancer later in life.  BREASTFEEDING FREQUENCY  A healthy, full-term baby may breastfeed as often as every hour or space his or her feedings to every 3 hours. Breastfeeding frequency will vary from baby to baby.   Newborns should be fed no less than every 2 3 hours during the day and every 4 5 hours during the  night. You should breastfeed a minimum of 8 feedings in a 24 hour period.  Awaken your baby to breastfeed if it has been 3 4 hours since the last feeding.  Breastfeed when you feel the need to reduce the fullness of your breasts or when   your newborn shows signs of hunger. Signs that your baby may be hungry include:  Increased alertness or activity.  Stretching.  Movement of the head from side to side.  Movement of the head and opening of the mouth when the corner of the mouth or cheek is stroked (rooting).  Increased sucking sounds, smacking lips, cooing, sighing, or squeaking.  Hand-to-mouth movements.  Increased sucking of fingers or hands.  Fussing.  Intermittent crying.  Signs of extreme hunger will require calming and consoling before you try to feed your baby. Signs of extreme hunger may include:  Restlessness.  A loud, strong cry.  Screaming.  Frequent feeding will help you make more milk and will help prevent problems, such as sore nipples and engorgement of the breasts.  BREASTFEEDING   Whether lying down or sitting, be sure that the baby's abdomen is facing your abdomen.   Support your breast with 4 fingers under your breast and your thumb above your nipple. Make sure your fingers are well away from your nipple and your baby's mouth.   Stroke your baby's lips gently with your finger or nipple.   When your baby's mouth is open wide enough, place all of your nipple and as much of the colored area around your nipple (areola) as possible into your baby's mouth.  More areola should be visible above his or her upper lip than below his or her lower lip.  Your baby's tongue should be between his or her lower gum and your breast.  Ensure that your baby's mouth is correctly positioned around the nipple (latched). Your baby's lips should create a seal on your breast.  Signs that your baby has effectively latched onto your nipple include:  Tugging or sucking  without pain.  Swallowing heard between sucks.  Absent click or smacking sound.  Muscle movement above and in front of his or her ears with sucking.  Your baby must suck about 2 3 minutes in order to get your milk. Allow your baby to feed on each breast as long as he or she wants. Nurse your baby until he or she unlatches or falls asleep at the first breast, then offer the second breast.  Signs that your baby is full and satisfied include:  A gradual decrease in the number of sucks or complete cessation of sucking.  Falling asleep.  Extension or relaxation of his or her body.  Retention of a small amount of milk in his or her mouth.  Letting go of your breast by himself or herself.  Signs of effective breastfeeding in you include:  Breasts that have increased firmness, weight, and size prior to feeding.  Breasts that are softer after nursing.  Increased milk volume, as well as a change in milk consistency and color by the 5th day of breastfeeding.  Breast fullness relieved by breastfeeding.  Nipples are not sore, cracked, or bleeding.  If needed, break the suction by putting your finger into the corner of your baby's mouth and sliding your finger between his or her gums. Then, remove your breast from his or her mouth.  It is common for babies to spit up a small amount after a feeding.  Babies often swallow air during feeding. This can make babies fussy. Burping your baby between breasts can help with this.  Vitamin D supplements are recommended for babies who get only breast milk.  Avoid using a pacifier during your baby's first 4 6 weeks.  Avoid supplemental feedings of water, formula, or   juice in place of breastfeeding. Breast milk is all the food your baby needs. It is not necessary for your baby to have water or formula. Your breasts will make more milk if supplemental feedings are avoided during the early weeks. HOW TO TELL WHETHER YOUR BABY IS GETTING ENOUGH BREAST  MILK Wondering whether or not your baby is getting enough milk is a common concern among mothers. You can be assured that your baby is getting enough milk if:   Your baby is actively sucking and you hear swallowing.   Your baby seems relaxed and satisfied after a feeding.   Your baby nurses at least 8 12 times in a 24 hour time period.  During the first 3 5 days of age:  Your baby is wetting at least 3 5 diapers in a 24 hour period. The urine should be clear and pale yellow.  Your baby is having at least 3 4 stools in a 24 hour period. The stool should be soft and yellow.  At 5 7 days of age, your baby is having at least 3 6 stools in a 24 hour period. The stool should be seedy and yellow by 5 days of age.  Your baby has a weight loss less than 7 10% during the first 3 days of age.  Your baby does not lose weight after 3 7 days of age.  Your baby gains 4 7 ounces each week after he or she is 4 days of age.  Your baby gains weight by 5 days of age and is back to birth weight within 2 weeks. ENGORGEMENT In the first week after your baby is born, you may experience extremely full breasts (engorgement). When engorged, your breasts may feel heavy, warm, or tender to the touch. Engorgement peaks within 24 48 hours after delivery of your baby.  Engorgement may be reduced by:  Continuing to breastfeed.  Increasing the frequency of breastfeeding.  Taking warm showers or applying warm, moist heat to your breasts just before each feeding. This increases circulation and helps the milk flow.   Gently massaging your breast before and during the feedings. With your fingertips, massage from your chest wall towards your nipple in a circular motion.   Ensuring that your baby empties at least one breast at every feeding. It also helps to start the next feeding on the opposite breast.   Expressing breast milk by hand or by using a breast pump to empty the breasts if your baby is sleepy, or  not nursing well. You may also want to express milk if you are returning to work oryou feel you are getting engorged.  Ensuring your baby is latched on and positioned properly while breastfeeding. If you follow these suggestions, your engorgement should improve in 24 48 hours. If you are still experiencing difficulty, call your lactation consultant or caregiver.  CARING FOR YOURSELF Take care of your breasts.  Bathe or shower daily.   Avoid using soap on your nipples.   Wear a supportive bra. Avoid wearing underwire style bras.  Air dry your nipples for a 3 4minutes after each feeding.   Use only cotton bra pads to absorb breast milk leakage. Leaking of breast milk between feedings is normal.   Use only pure lanolin on your nipples after nursing. You do not need to wash it off before feeding your baby again. Another option is to express a few drops of breast milk and gently massage that milk into your nipples.  Continue   breast self-awareness checks. Take care of yourself.  Eat healthy foods. Alternate 3 meals with 3 snacks.  Avoid foods that you notice affect your baby in a bad way.  Drink milk, fruit juice, and water to satisfy your thirst (about 8 glasses a day).   Rest often, relax, and take your prenatal vitamins to prevent fatigue, stress, and anemia.  Avoid chewing and smoking tobacco.  Avoid alcohol and drug use.  Take over-the-counter and prescribed medicine only as directed by your caregiver or pharmacist. You should always check with your caregiver or pharmacist before taking any new medicine, vitamin, or herbal supplement.  Know that pregnancy is possible while breastfeeding. If desired, talk to your caregiver about family planning and safe birth control methods that may be used while breastfeeding. SEEK MEDICAL CARE IF:   You feel like you want to stop breastfeeding or have become frustrated with breastfeeding.  You have painful breasts or nipples.  Your  nipples are cracked or bleeding.  Your breasts are red, tender, or warm.  You have a swollen area on either breast.  You have a fever or chills.  You have nausea or vomiting.  You have drainage from your nipples.  Your breasts do not become full before feedings by the 5th day after delivery.  You feel sad and depressed.  Your baby is too sleepy to eat well.  Your baby is having trouble sleeping.   Your baby is wetting less than 3 diapers in a 24 hour period.  Your baby has less than 3 stools in a 24 hour period.  Your baby's skin or the white part of his or her eyes becomes more yellow.   Your baby is not gaining weight by 5 days of age. MAKE SURE YOU:   Understand these instructions.  Will watch your condition.  Will get help right away if you are not doing well or get worse. Document Released: 02/12/2005 Document Revised: 11/07/2011 Document Reviewed: 09/19/2011 [employer] Patient Information 2014 [employer].  

## 2012-07-29 NOTE — Progress Notes (Signed)
Doing well. She has declined further u/s with MFM for thickened nuchal fold. 28 wks labs next visit + TDaP.

## 2012-08-19 ENCOUNTER — Encounter: Payer: Self-pay | Admitting: Obstetrics and Gynecology

## 2012-08-19 ENCOUNTER — Ambulatory Visit (INDEPENDENT_AMBULATORY_CARE_PROVIDER_SITE_OTHER): Payer: BC Managed Care – PPO | Admitting: Obstetrics and Gynecology

## 2012-08-19 VITALS — BP 108/66 | Wt 173.0 lb

## 2012-08-19 DIAGNOSIS — O09523 Supervision of elderly multigravida, third trimester: Secondary | ICD-10-CM

## 2012-08-19 DIAGNOSIS — Z3480 Encounter for supervision of other normal pregnancy, unspecified trimester: Secondary | ICD-10-CM

## 2012-08-19 DIAGNOSIS — O09529 Supervision of elderly multigravida, unspecified trimester: Secondary | ICD-10-CM

## 2012-08-19 LAB — CBC
HCT: 34.8 % — ABNORMAL LOW (ref 36.0–46.0)
Hemoglobin: 11.6 g/dL — ABNORMAL LOW (ref 12.0–15.0)
MCH: 28.6 pg (ref 26.0–34.0)
MCV: 85.7 fL (ref 78.0–100.0)
RBC: 4.06 MIL/uL (ref 3.87–5.11)

## 2012-08-19 MED ORDER — TETANUS-DIPHTH-ACELL PERTUSSIS 5-2.5-18.5 LF-MCG/0.5 IM SUSP: 0.5000 mL | Freq: Once | INTRAMUSCULAR | Status: DC

## 2012-08-19 NOTE — Progress Notes (Signed)
Having some lower back pain.  Doing 1hr GTT today.

## 2012-08-19 NOTE — Progress Notes (Signed)
Patient doing well without complaints. FM/PTL precautions reviewed. 1hr GCT and labs today 

## 2012-08-20 LAB — RPR

## 2012-08-21 ENCOUNTER — Encounter: Payer: Self-pay | Admitting: Obstetrics and Gynecology

## 2012-09-09 ENCOUNTER — Encounter: Payer: BC Managed Care – PPO | Admitting: Obstetrics & Gynecology

## 2012-09-23 ENCOUNTER — Ambulatory Visit (INDEPENDENT_AMBULATORY_CARE_PROVIDER_SITE_OTHER): Payer: BC Managed Care – PPO | Admitting: Obstetrics & Gynecology

## 2012-09-23 VITALS — BP 132/78 | Wt 175.0 lb

## 2012-09-23 DIAGNOSIS — O09529 Supervision of elderly multigravida, unspecified trimester: Secondary | ICD-10-CM

## 2012-09-23 DIAGNOSIS — Z3480 Encounter for supervision of other normal pregnancy, unspecified trimester: Secondary | ICD-10-CM

## 2012-09-23 DIAGNOSIS — O09523 Supervision of elderly multigravida, third trimester: Secondary | ICD-10-CM

## 2012-09-23 NOTE — Progress Notes (Signed)
P = 76 

## 2012-09-23 NOTE — Progress Notes (Signed)
No complaints or concerns.  Fetal movement and labor precautions reviewed. 

## 2012-09-23 NOTE — Patient Instructions (Signed)
Return to clinic for any obstetric concerns or go to [facility] for evaluation  

## 2012-10-07 ENCOUNTER — Ambulatory Visit (INDEPENDENT_AMBULATORY_CARE_PROVIDER_SITE_OTHER): Payer: BC Managed Care – PPO | Admitting: Obstetrics & Gynecology

## 2012-10-07 ENCOUNTER — Encounter: Payer: Self-pay | Admitting: Obstetrics & Gynecology

## 2012-10-07 VITALS — BP 131/68 | Wt 179.0 lb

## 2012-10-07 DIAGNOSIS — Z3480 Encounter for supervision of other normal pregnancy, unspecified trimester: Secondary | ICD-10-CM

## 2012-10-07 DIAGNOSIS — O09529 Supervision of elderly multigravida, unspecified trimester: Secondary | ICD-10-CM

## 2012-10-07 NOTE — Progress Notes (Signed)
P-[id number] 

## 2012-10-07 NOTE — Progress Notes (Signed)
Routine visit. Good FM. She denies problems. Labor precautions. GBS/CT/GC done today.

## 2012-10-08 LAB — GC/CHLAMYDIA PROBE AMP
CT Probe RNA: NEGATIVE
GC Probe RNA: NEGATIVE

## 2012-10-14 ENCOUNTER — Inpatient Hospital Stay (HOSPITAL_COMMUNITY)
Admission: AD | Admit: 2012-10-14 | Discharge: 2012-10-15 | DRG: 379 | Disposition: A | Discharge status: Home / Self Care | Payer: BC Managed Care – PPO | Source: Ambulatory Visit | Attending: Obstetrics & Gynecology | Admitting: Obstetrics & Gynecology

## 2012-10-14 ENCOUNTER — Encounter (HOSPITAL_COMMUNITY): Payer: Self-pay | Admitting: *Deleted

## 2012-10-14 ENCOUNTER — Ambulatory Visit (INDEPENDENT_AMBULATORY_CARE_PROVIDER_SITE_OTHER): Payer: BC Managed Care – PPO | Admitting: Obstetrics & Gynecology

## 2012-10-14 VITALS — BP 110/75 | Wt 179.0 lb

## 2012-10-14 DIAGNOSIS — O4700 False labor before 37 completed weeks of gestation, unspecified trimester: Secondary | ICD-10-CM

## 2012-10-14 DIAGNOSIS — O09529 Supervision of elderly multigravida, unspecified trimester: Secondary | ICD-10-CM | POA: Diagnosis present

## 2012-10-14 DIAGNOSIS — O09523 Supervision of elderly multigravida, third trimester: Secondary | ICD-10-CM

## 2012-10-14 DIAGNOSIS — Z3480 Encounter for supervision of other normal pregnancy, unspecified trimester: Secondary | ICD-10-CM

## 2012-10-14 HISTORY — DX: Other specified health status: Z78.9

## 2012-10-14 LAB — TYPE AND SCREEN
ABO/RH(D): O POS
Antibody Screen: NEGATIVE

## 2012-10-14 LAB — CBC
HCT: 34.1 % — ABNORMAL LOW (ref 36.0–46.0)
Platelets: 275 10*3/uL (ref 150–400)
RDW: 13.3 % (ref 11.5–15.5)
WBC: 9.6 10*3/uL (ref 4.0–10.5)

## 2012-10-14 MED ORDER — ONDANSETRON HCL 4 MG/2ML IJ SOLN: 4.0000 mg | Freq: Four times a day (QID) | INTRAMUSCULAR | Status: DC | PRN

## 2012-10-14 MED ORDER — ACETAMINOPHEN 325 MG PO TABS: 650.0000 mg | ORAL_TABLET | ORAL | Status: DC | PRN

## 2012-10-14 MED ORDER — LIDOCAINE HCL (PF) 1 % IJ SOLN: 30.0000 mL | INTRAMUSCULAR | Status: DC | PRN

## 2012-10-14 MED ORDER — OXYTOCIN BOLUS FROM INFUSION: 500.0000 mL | INTRAVENOUS | Status: DC

## 2012-10-14 MED ORDER — OXYTOCIN 40 UNITS IN LACTATED RINGERS INFUSION - SIMPLE MED: 62.5000 mL/h | INTRAVENOUS | Status: DC

## 2012-10-14 MED ORDER — OXYCODONE-ACETAMINOPHEN 5-325 MG PO TABS: 1.0000 | ORAL_TABLET | ORAL | Status: DC | PRN

## 2012-10-14 MED ORDER — CITRIC ACID-SODIUM CITRATE 334-500 MG/5ML PO SOLN: 30.0000 mL | ORAL | Status: DC | PRN

## 2012-10-14 MED ORDER — IBUPROFEN 600 MG PO TABS: 600.0000 mg | ORAL_TABLET | Freq: Four times a day (QID) | ORAL | Status: DC | PRN

## 2012-10-14 MED ORDER — LACTATED RINGERS IV SOLN: 500.0000 mL | INTRAVENOUS | Status: DC | PRN

## 2012-10-14 MED ORDER — LACTATED RINGERS IV SOLN
INTRAVENOUS | Status: DC
  Administered 2012-10-14 – 2012-10-15 (×2): via INTRAVENOUS

## 2012-10-14 NOTE — [facility] Note (Signed)
Patient states she is having contractions every 2-3 minutes. Denies bleeding or leaking and reports good fetal movement. Was 4cm in the clinic this am.

## 2012-10-14 NOTE — Progress Notes (Signed)
P-75 

## 2012-10-14 NOTE — Progress Notes (Signed)
GBS and GC/Chlam negative.  Significant cervical change, had small amount of bleeding during check. No other complaints or concerns.  Fetal movement and labor precautions reviewed.

## 2012-10-14 NOTE — H&P (Signed)
[name] [name] is a 37 y.o. female presenting for contractions every 2-3 minutes admitted for active labor. History OB History   Grav Para Term Preterm Abortions TAB SAB Ect Mult Living   4 3 3       3      Past Medical History  Diagnosis Date  . Medical history non-contributory    Past Surgical History  Procedure Laterality Date  . No past surgeries     Family History: family history includes Stroke in her father. Social History:  reports that she has never smoked. She does not have any smokeless tobacco history on file. She reports that she does not drink alcohol or use illicit drugs.   Prenatal Transfer Tool  Maternal Diabetes: No Genetic Screening: Normal Maternal Ultrasounds/Referrals: Abnormal:  Findings:   Other: Thick nuchal fold Fetal Ultrasounds or other Referrals:  None Maternal Substance Abuse:  No Significant Maternal Medications:  None Significant Maternal Lab Results:  None Other Comments:  None  ROS    Blood pressure 135/74, pulse 89, temperature 98.3 F (36.8 C), resp. rate 20, height 5' 3.5" (1.613 m), weight 82.101 kg (181 lb), last menstrual period 02/02/2012. Exam Physical Exam  Prenatal labs: ABO, Rh: O/POS/-- (02/10 1044) Antibody: NEG (02/10 1044) Rubella: 11.30 (02/10 1044) RPR: NON REAC (06/24 1010)  HBsAg: NEGATIVE (02/10 1044)  HIV: NON REACTIVE (02/10 1044)  GBS:   Negative 1 hour Glucose: 118  Assessment/Plan:  Preterm Labor  - currently in active labor. Cervix is 4.5cm and soft. Contractions are regular every 4-5 minutes - admit to birthing suite, admitting attending Dr. Macon Large - routine orders   Jacquelin Hawking, MD 10/14/2012, 5:24 PM Evaluation and management procedures were performed by Resident physician under my supervision/collaboration. Chart reviewed, patient examined by me and I agree with management and plan. Danae Orleans, CNM 10/15/2012 10:39 AM

## 2012-10-14 NOTE — Patient Instructions (Signed)
Return to clinic for any obstetric concerns or go to [facility] for evaluation  

## 2012-10-14 NOTE — Progress Notes (Signed)
Dr Caleb Popp on unit and given report. In to see pt

## 2012-10-14 NOTE — Progress Notes (Signed)
[name] [name] is a 37 y.o. G4P3003 at [redacted]w[redacted]d by LMP admitted for spontaneous onset of labor.  Subjective: Feels well, having some pain/pressure down below with ambulation.  Objective: BP 114/80  Pulse 83  Temp(Src) 98.8 F (37.1 C) (Oral)  Resp 18  Ht 5' 3.5" (1.613 m)  Wt 181 lb (82.101 kg)  BMI 31.56 kg/m2  LMP 02/02/2012      FHT:  FHR: 130s bpm, variability: moderate,  accelerations:  Present,  decelerations:  Absent UC:   regular, every 5-6 minutes SVE:   Dilation: 4.5 Effacement (%): 70 Station: -1 Exam by:: Claud Kelp RN/ Dr. Caleb Popp  Labs: Lab Results  Component Value Date   WBC 9.6 10/14/2012   HGB 11.7* 10/14/2012   HCT 34.1* 10/14/2012   MCV 84.8 10/14/2012   PLT 275 10/14/2012    Assessment / Plan: Spontaneous labor  Labor: Progressing normally. Will not augment labor due to gestational age <37 weeks. Will defer cervical exams unless patient begins feeling increased pain/pressure as we are not augmenting labor. Fetal Wellbeing:  Category I Pain Control:  Labor support without medications I/D:  GBS negative Anticipated MOD:  NSVD  [name] 10/14/2012, 10:42 PM

## 2012-10-15 DIAGNOSIS — O4700 False labor before 37 completed weeks of gestation, unspecified trimester: Principal | ICD-10-CM

## 2012-10-15 DIAGNOSIS — O09529 Supervision of elderly multigravida, unspecified trimester: Secondary | ICD-10-CM

## 2012-10-15 NOTE — Progress Notes (Signed)
Pt to be discharged home

## 2012-10-15 NOTE — Progress Notes (Signed)
Patient discharged home with significant other. Discharge instructions reviewed. Patient and spouse verbalize understanding. All belongings at the bedside gathered and with patient.

## 2012-10-15 NOTE — Discharge Summary (Cosign Needed)
Obstetric Discharge Summary Reason for Admission: evaluation of preterm labor Prenatal Procedures: NST, continuous monitoring, repeat cervical check Hemoglobin  Date Value Range Status  10/14/2012 11.7* 12.0 - 15.0 g/dL Final     HCT  Date Value Range Status  10/14/2012 34.1* 36.0 - 46.0 % Final    Physical Exam:  General: alert, cooperative and appears stated age 37: None Dilation: 4.5 Effacement (%): 60 Cervical Position: Posterior Station: -3 Presentation: Vertex Exam by:: Adalene Gulotta, MD DVT Evaluation: No evidence of DVT seen on physical exam.  Hospital Course: Pt admittedon 8/19 for evaluation of preterm labor. Pt with unchanged cervical exam at 4.5/60-3 at [redacted]w[redacted]d. Pt with good fetal movement, no LOF, no VB, minimal contractions. Pt otherwise is doing well. Discussed care and preference to observe pt until term. Pt gvien strict return precautions and warned while pt is in latent labor at this time, she could progress at any time. Pt and husband stated understanding.   Discharge Diagnoses: False labor-undelivered FWB: Reactive and reassuring, FHT 145 mod var, + accel no decel.  Discharge Information: Date: 10/15/2012 Activity: pelvic rest Diet: routine Medications: PNV Condition: stable Instructions: refer to practice specific booklet Discharge to: home  Tawana Scale 10/15/2012, 9:39 AM

## 2012-10-17 NOTE — H&P (Signed)
Attestation of Attending Supervision of Advanced Practitioner (PA/CNM/NP): Evaluation and management procedures were performed by the Advanced Practitioner under my supervision and collaboration.  I have reviewed the Advanced Practitioner's note and chart, and I agree with the management and plan.  Corinna Burkman, MD, FACOG Attending Obstetrician & Gynecologist Faculty Practice, [facility]  

## 2012-10-21 ENCOUNTER — Encounter: Payer: BC Managed Care – PPO | Admitting: Family Medicine

## 2012-10-21 ENCOUNTER — Ambulatory Visit (INDEPENDENT_AMBULATORY_CARE_PROVIDER_SITE_OTHER): Payer: BC Managed Care – PPO | Admitting: Family Medicine

## 2012-10-21 VITALS — BP 129/86 | Wt 181.0 lb

## 2012-10-21 DIAGNOSIS — O09523 Supervision of elderly multigravida, third trimester: Secondary | ICD-10-CM

## 2012-10-21 DIAGNOSIS — Z3480 Encounter for supervision of other normal pregnancy, unspecified trimester: Secondary | ICD-10-CM

## 2012-10-21 DIAGNOSIS — O09529 Supervision of elderly multigravida, unspecified trimester: Secondary | ICD-10-CM

## 2012-10-21 NOTE — Patient Instructions (Addendum)
Vaginal Delivery Your caregiver must first be sure you are in labor. Signs of labor include:  You may pass what is called "the mucus plug" before labor begins. This is a small amount of blood stained mucus.  Regular uterine contractions.  The time between contractions get closer together.  The discomfort and pain gradually gets more intense.  Pains are mostly located in the back.  Pains get worse when walking.  The cervix (the opening of the uterus) becomes thinner (begins to efface) and opens up (dilates). Once you are in labor and admitted into the hospital or care center, your caregiver will do the following:  A complete physical examination.  Check your vital signs (blood pressure, pulse, temperature and the fetal heart rate).  Do a vaginal examination (using a sterile glove and lubricant) to determine:  The position (presentation) of the baby (head [vertex] or buttock first).  The level (station) of the baby's head in the birth canal.  The effacement and dilatation of the cervix.  You may have your pubic hair shaved and be given an enema depending on your caregiver and the circumstance.  An electronic monitor is usually placed on your abdomen. The monitor follows the length and intensity of the contractions, as well as the baby's heart rate.  Usually, your caregiver will insert an IV in your arm with a bottle of sugar water. This is done as a precaution so that medications can be given to you quickly during labor or delivery. NORMAL LABOR AND DELIVERY IS DIVIDED UP INTO 3 STAGES: First Stage This is when regular contractions begin and the cervix begins to efface and dilate. This stage can last from 3 to 15 hours. The end of the first stage is when the cervix is 100% effaced and 10 centimeters dilated. Pain medications may be given by   Injection (morphine, demerol, etc.)  Regional anesthesia (spinal, caudal or epidural, anesthetics given in different locations of the  spine). Paracervical pain medication may be given, which is an injection of and anesthetic on each side of the cervix. A pregnant woman may request to have "Natural Childbirth" which is not to have any medications or anesthesia during her labor and delivery. Second Stage This is when the baby comes down through the birth canal (vagina) and is born. This can take 1 to 4 hours. As the baby's head comes down through the birth canal, you may feel like you are going to have a bowel movement. You will get the urge to bear down and push until the baby is delivered. As the baby's head is being delivered, the caregiver will decide if an episiotomy (a cut in the perineum and vagina area) is needed to prevent tearing of the tissue in this area. The episiotomy is sewn up after the delivery of the baby and placenta. Sometimes a mask with nitrous oxide is given for the mother to breath during the delivery of the baby to help if there is too much pain. The end of Stage 2 is when the baby is fully delivered. Then when the umbilical cord stops pulsating it is clamped and cut. Third Stage The third stage begins after the baby is completely delivered and ends after the placenta (afterbirth) is delivered. This usually takes 5 to 30 minutes. After the placenta is delivered, a medication is given either by intravenous or injection to help contract the uterus and prevent bleeding. The third stage is not painful and pain medication is usually not necessary. If an   episiotomy was done, it is repaired at this time. After the delivery, the mother is watched and monitored closely for 1 to 2 hours to make sure there is no postpartum bleeding (hemorrhage). If there is a lot of bleeding, medication is given to contract the uterus and stop the bleeding. Document Released: 11/22/2007 Document Revised: 11/07/2011 Document Reviewed: 11/22/2007 [employer] Patient Information 2014 [employer].  Pregnancy - Third Trimester The third  trimester of pregnancy (the last 3 months) is a period of the most rapid growth for you and your baby. The baby approaches a length of 20 inches and a weight of 6 to 10 pounds. The baby is adding on fat and getting ready for life outside your body. While inside, babies have periods of sleeping and waking, sucking thumbs, and hiccuping. You can often feel small contractions of the uterus. This is false labor. It is also called Braxton-Hicks contractions. This is like a practice for labor. The usual problems in this stage of pregnancy include more difficulty breathing, swelling of the hands and feet from water retention, and having to urinate more often because of the uterus and baby pressing on your bladder.  PRENATAL EXAMS  Blood work may continue to be done during prenatal exams. These tests are done to check on your health and the probable health of your baby. Blood work is used to follow your blood levels (hemoglobin). Anemia (low hemoglobin) is common during pregnancy. Iron and vitamins are given to help prevent this. You may also continue to be checked for diabetes. Some of the past blood tests may be done again.  The size of the uterus is measured during each visit. This makes sure your baby is growing properly according to your pregnancy dates.  Your blood pressure is checked every prenatal visit. This is to make sure you are not getting toxemia.  Your urine is checked every prenatal visit for infection, diabetes, and protein.  Your weight is checked at each visit. This is done to make sure gains are happening at the suggested rate and that you and your baby are growing normally.  Sometimes, an ultrasound is performed to confirm the position and the proper growth and development of the baby. This is a test done that bounces harmless sound waves off the baby so your caregiver can more accurately determine a due date.  Discuss the type of pain medicine and anesthesia you will have during your  labor and delivery.  Discuss the possibility and anesthesia if a cesarean section might be necessary.  Inform your caregiver if there is any mental or physical violence at home. Sometimes, a specialized non-stress test, contraction stress test, and biophysical profile are done to make sure the baby is not having a problem. Checking the amniotic fluid surrounding the baby is called an amniocentesis. The amniotic fluid is removed by sticking a needle into the belly (abdomen). This is sometimes done near the end of pregnancy if an early delivery is required. In this case, it is done to help make sure the baby's lungs are mature enough for the baby to live outside of the womb. If the lungs are not mature and it is unsafe to deliver the baby, an injection of cortisone medicine is given to the mother 1 to 2 days before the delivery. This helps the baby's lungs mature and makes it safer to deliver the baby. CHANGES OCCURING IN THE THIRD TRIMESTER OF PREGNANCY Your body goes through many changes during pregnancy. They vary from person   to person. Talk to your caregiver about changes you notice and are concerned about.  During the last trimester, you have probably had an increase in your appetite. It is normal to have cravings for certain foods. This varies from person to person and pregnancy to pregnancy.  You may begin to get stretch marks on your hips, abdomen, and breasts. These are normal changes in the body during pregnancy. There are no exercises or medicines to take which prevent this change.  Constipation may be treated with a stool softener or adding bulk to your diet. Drinking lots of fluids, fiber in vegetables, fruits, and whole grains are helpful.  Exercising is also helpful. If you have been very active up until your pregnancy, most of these activities can be continued during your pregnancy. If you have been less active, it is helpful to start an exercise program such as walking. Consult your  caregiver before starting exercise programs.  Avoid all smoking, alcohol, non-prescribed drugs, herbs and "street drugs" during your pregnancy. These chemicals affect the formation and growth of the baby. Avoid chemicals throughout the pregnancy to ensure the delivery of a healthy infant.  Backache, varicose veins, and hemorrhoids may develop or get worse.  You will tire more easily in the third trimester, which is normal.  The baby's movements may be stronger and more often.  You may become short of breath easily.  Your belly button may stick out.  A yellow discharge may leak from your breasts called colostrum.  You may have a bloody mucus discharge. This usually occurs a few days to a week before labor begins. HOME CARE INSTRUCTIONS   Keep your caregiver's appointments. Follow your caregiver's instructions regarding medicine use, exercise, and diet.  During pregnancy, you are providing food for you and your baby. Continue to eat regular, well-balanced meals. Choose foods such as meat, fish, milk and other low fat dairy products, vegetables, fruits, and whole-grain breads and cereals. Your caregiver will tell you of the ideal weight gain.  A physical sexual relationship may be continued throughout pregnancy if there are no other problems such as early (premature) leaking of amniotic fluid from the membranes, vaginal bleeding, or belly (abdominal) pain.  Exercise regularly if there are no restrictions. Check with your caregiver if you are unsure of the safety of your exercises. Greater weight gain will occur in the last 2 trimesters of pregnancy. Exercising helps:  Control your weight.  Get you in shape for labor and delivery.  You lose weight after you deliver.  Rest a lot with legs elevated, or as needed for leg cramps or low back pain.  Wear a good support or jogging bra for breast tenderness during pregnancy. This may help if worn during sleep. Pads or tissues may be used in  the bra if you are leaking colostrum.  Do not use hot tubs, steam rooms, or saunas.  Wear your seat belt when driving. This protects you and your baby if you are in an accident.  Avoid raw meat, cat litter boxes and soil used by cats. These carry germs that can cause birth defects in the baby.  It is easier to leak urine during pregnancy. Tightening up and strengthening the pelvic muscles will help with this problem. You can practice stopping your urination while you are going to the bathroom. These are the same muscles you need to strengthen. It is also the muscles you would use if you were trying to stop from passing gas. You can practice   tightening these muscles up 10 times a set and repeating this about 3 times per day. Once you know what muscles to tighten up, do not perform these exercises during urination. It is more likely to cause an infection by backing up the urine.  Ask for help if you have financial, counseling, or nutritional needs during pregnancy. Your caregiver will be able to offer counseling for these needs as well as refer you for other special needs.  Make a list of emergency phone numbers and have them available.  Plan on getting help from family or friends when you go home from the hospital.  Make a trial run to the hospital.  Take prenatal classes with the father to understand, practice, and ask questions about the labor and delivery.  Prepare the baby's room or nursery.  Do not travel out of the city unless it is absolutely necessary and with the advice of your caregiver.  Wear only low or no heal shoes to have better balance and prevent falling. MEDICINES AND DRUG USE IN PREGNANCY  Take prenatal vitamins as directed. The vitamin should contain 1 milligram of folic acid. Keep all vitamins out of reach of children. Only a couple vitamins or tablets containing iron may be fatal to a baby or young child when ingested.  Avoid use of all medicines, including herbs,  over-the-counter medicines, not prescribed or suggested by your caregiver. Only take over-the-counter or prescription medicines for pain, discomfort, or fever as directed by your caregiver. Do not use aspirin, ibuprofen or naproxen unless approved by your caregiver.  Let your caregiver also know about herbs you may be using.  Alcohol is related to a number of birth defects. This includes fetal alcohol syndrome. All alcohol, in any form, should be avoided completely. Smoking will cause low birth rate and premature babies.  Illegal drugs are very harmful to the baby. They are absolutely forbidden. A baby born to an addicted mother will be addicted at birth. The baby will go through the same withdrawal an adult does. SEEK MEDICAL CARE IF: You have any concerns or worries during your pregnancy. It is better to call with your questions if you feel they cannot wait, rather than worry about them. SEEK IMMEDIATE MEDICAL CARE IF:   An unexplained oral temperature above 102 F (38.9 C) develops, or as your caregiver suggests.  You have leaking of fluid from the vagina. If leaking membranes are suspected, take your temperature and tell your caregiver of this when you call.  There is vaginal spotting, bleeding or passing clots. Tell your caregiver of the amount and how many pads are used.  You develop a bad smelling vaginal discharge with a change in the color from clear to white.  You develop vomiting that lasts more than 24 hours.  You develop chills or fever.  You develop shortness of breath.  You develop burning on urination.  You loose more than 2 pounds of weight or gain more than 2 pounds of weight or as suggested by your caregiver.  You notice sudden swelling of your face, hands, and feet or legs.  You develop belly (abdominal) pain. Round ligament discomfort is a common non-cancerous (benign) cause of abdominal pain in pregnancy. Your caregiver still must evaluate you.  You develop a  severe headache that does not go away.  You develop visual problems, blurred or double vision.  If you have not felt your baby move for more than 1 hour. If you think the baby is not   moving as much as usual, eat something with sugar in it and lie down on your left side for an hour. The baby should move at least 4 to 5 times per hour. Call right away if your baby moves less than that.  You fall, are in a car accident, or any kind of trauma.  There is mental or physical violence at home. Document Released: 02/06/2001 Document Revised: 11/07/2011 Document Reviewed: 08/11/2008 [employer] Patient Information 2014 [employer].  Breastfeeding A change in hormones during your pregnancy causes growth of your breast tissue and an increase in number and size of milk ducts. The hormone prolactin allows proteins, sugars, and fats from your blood supply to make breast milk in your milk-producing glands. The hormone progesterone prevents breast milk from being released before the birth of your baby. After the birth of your baby, your progesterone level decreases allowing breast milk to be released. Thoughts of your baby, as well as his or her sucking or crying, can stimulate the release of milk from the milk-producing glands. Deciding to breastfeed (nurse) is one of the best choices you can make for you and your baby. The information that follows gives a brief review of the benefits, as well as other important skills to know about breastfeeding. BENEFITS OF BREASTFEEDING For your baby  The first milk (colostrum) helps your baby's digestive system function better.   There are antibodies in your milk that help your baby fight off infections.   Your baby has a lower incidence of asthma, allergies, and sudden infant death syndrome (SIDS).   The nutrients in breast milk are better for your baby than infant formulas.  Breast milk improves your baby's brain development.   Your baby will have less gas,  colic, and constipation.  Your baby is less likely to develop other conditions, such as childhood obesity, asthma, or diabetes mellitus. For you  Breastfeeding helps develop a very special bond between you and your baby.   Breastfeeding is convenient, always available at the correct temperature, and costs nothing.   Breastfeeding helps to burn calories and helps you lose the weight gained during pregnancy.   Breastfeeding makes your uterus contract back down to normal size faster and slows bleeding following delivery.   Breastfeeding mothers have a lower risk of developing osteoporosis or breast or ovarian cancer later in life.  BREASTFEEDING FREQUENCY  A healthy, full-term baby may breastfeed as often as every hour or space his or her feedings to every 3 hours. Breastfeeding frequency will vary from baby to baby.   Newborns should be fed no less than every 2 3 hours during the day and every 4 5 hours during the night. You should breastfeed a minimum of 8 feedings in a 24 hour period.  Awaken your baby to breastfeed if it has been 3 4 hours since the last feeding.  Breastfeed when you feel the need to reduce the fullness of your breasts or when your newborn shows signs of hunger. Signs that your baby may be hungry include:  Increased alertness or activity.  Stretching.  Movement of the head from side to side.  Movement of the head and opening of the mouth when the corner of the mouth or cheek is stroked (rooting).  Increased sucking sounds, smacking lips, cooing, sighing, or squeaking.  Hand-to-mouth movements.  Increased sucking of fingers or hands.  Fussing.  Intermittent crying.  Signs of extreme hunger will require calming and consoling before you try to feed your baby. Signs   of extreme hunger may include:  Restlessness.  A loud, strong cry.  Screaming.  Frequent feeding will help you make more milk and will help prevent problems, such as sore nipples and  engorgement of the breasts.  BREASTFEEDING   Whether lying down or sitting, be sure that the baby's abdomen is facing your abdomen.   Support your breast with 4 fingers under your breast and your thumb above your nipple. Make sure your fingers are well away from your nipple and your baby's mouth.   Stroke your baby's lips gently with your finger or nipple.   When your baby's mouth is open wide enough, place all of your nipple and as much of the colored area around your nipple (areola) as possible into your baby's mouth.  More areola should be visible above his or her upper lip than below his or her lower lip.  Your baby's tongue should be between his or her lower gum and your breast.  Ensure that your baby's mouth is correctly positioned around the nipple (latched). Your baby's lips should create a seal on your breast.  Signs that your baby has effectively latched onto your nipple include:  Tugging or sucking without pain.  Swallowing heard between sucks.  Absent click or smacking sound.  Muscle movement above and in front of his or her ears with sucking.  Your baby must suck about 2 3 minutes in order to get your milk. Allow your baby to feed on each breast as long as he or she wants. Nurse your baby until he or she unlatches or falls asleep at the first breast, then offer the second breast.  Signs that your baby is full and satisfied include:  A gradual decrease in the number of sucks or complete cessation of sucking.  Falling asleep.  Extension or relaxation of his or her body.  Retention of a small amount of milk in his or her mouth.  Letting go of your breast by himself or herself.  Signs of effective breastfeeding in you include:  Breasts that have increased firmness, weight, and size prior to feeding.  Breasts that are softer after nursing.  Increased milk volume, as well as a change in milk consistency and color by the 5th day of breastfeeding.  Breast  fullness relieved by breastfeeding.  Nipples are not sore, cracked, or bleeding.  If needed, break the suction by putting your finger into the corner of your baby's mouth and sliding your finger between his or her gums. Then, remove your breast from his or her mouth.  It is common for babies to spit up a small amount after a feeding.  Babies often swallow air during feeding. This can make babies fussy. Burping your baby between breasts can help with this.  Vitamin D supplements are recommended for babies who get only breast milk.  Avoid using a pacifier during your baby's first 4 6 weeks.  Avoid supplemental feedings of water, formula, or juice in place of breastfeeding. Breast milk is all the food your baby needs. It is not necessary for your baby to have water or formula. Your breasts will make more milk if supplemental feedings are avoided during the early weeks. HOW TO TELL WHETHER YOUR BABY IS GETTING ENOUGH BREAST MILK Wondering whether or not your baby is getting enough milk is a common concern among mothers. You can be assured that your baby is getting enough milk if:   Your baby is actively sucking and you hear swallowing.     Your baby seems relaxed and satisfied after a feeding.   Your baby nurses at least 8 12 times in a 24 hour time period.  During the first 3 5 days of age:  Your baby is wetting at least 3 5 diapers in a 24 hour period. The urine should be clear and pale yellow.  Your baby is having at least 3 4 stools in a 24 hour period. The stool should be soft and yellow.  At 5 7 days of age, your baby is having at least 3 6 stools in a 24 hour period. The stool should be seedy and yellow by 5 days of age.  Your baby has a weight loss less than 7 10% during the first 3 days of age.  Your baby does not lose weight after 3 7 days of age.  Your baby gains 4 7 ounces each week after he or she is 4 days of age.  Your baby gains weight by 5 days of age and is back to  birth weight within 2 weeks. ENGORGEMENT In the first week after your baby is born, you may experience extremely full breasts (engorgement). When engorged, your breasts may feel heavy, warm, or tender to the touch. Engorgement peaks within 24 48 hours after delivery of your baby.  Engorgement may be reduced by:  Continuing to breastfeed.  Increasing the frequency of breastfeeding.  Taking warm showers or applying warm, moist heat to your breasts just before each feeding. This increases circulation and helps the milk flow.   Gently massaging your breast before and during the feedings. With your fingertips, massage from your chest wall towards your nipple in a circular motion.   Ensuring that your baby empties at least one breast at every feeding. It also helps to start the next feeding on the opposite breast.   Expressing breast milk by hand or by using a breast pump to empty the breasts if your baby is sleepy, or not nursing well. You may also want to express milk if you are returning to work oryou feel you are getting engorged.  Ensuring your baby is latched on and positioned properly while breastfeeding. If you follow these suggestions, your engorgement should improve in 24 48 hours. If you are still experiencing difficulty, call your lactation consultant or caregiver.  CARING FOR YOURSELF Take care of your breasts.  Bathe or shower daily.   Avoid using soap on your nipples.   Wear a supportive bra. Avoid wearing underwire style bras.  Air dry your nipples for a 3 4minutes after each feeding.   Use only cotton bra pads to absorb breast milk leakage. Leaking of breast milk between feedings is normal.   Use only pure lanolin on your nipples after nursing. You do not need to wash it off before feeding your baby again. Another option is to express a few drops of breast milk and gently massage that milk into your nipples.  Continue breast self-awareness checks. Take care of  yourself.  Eat healthy foods. Alternate 3 meals with 3 snacks.  Avoid foods that you notice affect your baby in a bad way.  Drink milk, fruit juice, and water to satisfy your thirst (about 8 glasses a day).   Rest often, relax, and take your prenatal vitamins to prevent fatigue, stress, and anemia.  Avoid chewing and smoking tobacco.  Avoid alcohol and drug use.  Take over-the-counter and prescribed medicine only as directed by your caregiver or pharmacist. You should always check with   your caregiver or pharmacist before taking any new medicine, vitamin, or herbal supplement.  Know that pregnancy is possible while breastfeeding. If desired, talk to your caregiver about family planning and safe birth control methods that may be used while breastfeeding. SEEK MEDICAL CARE IF:   You feel like you want to stop breastfeeding or have become frustrated with breastfeeding.  You have painful breasts or nipples.  Your nipples are cracked or bleeding.  Your breasts are red, tender, or warm.  You have a swollen area on either breast.  You have a fever or chills.  You have nausea or vomiting.  You have drainage from your nipples.  Your breasts do not become full before feedings by the 5th day after delivery.  You feel sad and depressed.  Your baby is too sleepy to eat well.  Your baby is having trouble sleeping.   Your baby is wetting less than 3 diapers in a 24 hour period.  Your baby has less than 3 stools in a 24 hour period.  Your baby's skin or the white part of his or her eyes becomes more yellow.   Your baby is not gaining weight by 5 days of age. MAKE SURE YOU:   Understand these instructions.  Will watch your condition.  Will get help right away if you are not doing well or get worse. Document Released: 02/12/2005 Document Revised: 11/07/2011 Document Reviewed: 09/19/2011 Exit[employer] Patient Information 2014 [employer].  

## 2012-10-21 NOTE — Progress Notes (Signed)
Cervix is very posterior and doesn't feel labored.  Lots of bloody show with check.

## 2012-10-21 NOTE — Progress Notes (Signed)
P - 81 

## 2012-10-21 NOTE — Assessment & Plan Note (Signed)
Labor precautions

## 2012-10-22 ENCOUNTER — Inpatient Hospital Stay (HOSPITAL_COMMUNITY)
Admission: AD | Admit: 2012-10-22 | Discharge: 2012-10-23 | DRG: 373 | Disposition: A | Discharge status: Home / Self Care | Payer: BC Managed Care – PPO | Source: Ambulatory Visit | Attending: Obstetrics & Gynecology | Admitting: Obstetrics & Gynecology

## 2012-10-22 ENCOUNTER — Encounter (HOSPITAL_COMMUNITY): Payer: Self-pay | Admitting: *Deleted

## 2012-10-22 DIAGNOSIS — O09529 Supervision of elderly multigravida, unspecified trimester: Secondary | ICD-10-CM | POA: Diagnosis present

## 2012-10-22 LAB — CBC
Hemoglobin: 12.1 g/dL (ref 12.0–15.0)
MCHC: 34.7 g/dL (ref 30.0–36.0)
RBC: 4.1 MIL/uL (ref 3.87–5.11)
WBC: 15.1 10*3/uL — ABNORMAL HIGH (ref 4.0–10.5)

## 2012-10-22 LAB — RPR: RPR Ser Ql: NONREACTIVE

## 2012-10-22 MED ORDER — LACTATED RINGERS IV SOLN: INTRAVENOUS | Status: DC

## 2012-10-22 MED ORDER — IBUPROFEN 600 MG PO TABS
600.0000 mg | ORAL_TABLET | Freq: Four times a day (QID) | ORAL | Status: DC
  Administered 2012-10-22 – 2012-10-23 (×2): 600 mg via ORAL
  Filled 2012-10-22 (×4): qty 1

## 2012-10-22 MED ORDER — LACTATED RINGERS IV SOLN: 500.0000 mL | INTRAVENOUS | Status: DC | PRN

## 2012-10-22 MED ORDER — OXYCODONE-ACETAMINOPHEN 5-325 MG PO TABS: 1.0000 | ORAL_TABLET | ORAL | Status: DC | PRN

## 2012-10-22 MED ORDER — OXYTOCIN 10 UNIT/ML IJ SOLN
10.0000 [IU] | Freq: Once | INTRAMUSCULAR | Status: AC
  Administered 2012-10-22: 10 [IU] via INTRAMUSCULAR

## 2012-10-22 MED ORDER — CITRIC ACID-SODIUM CITRATE 334-500 MG/5ML PO SOLN: 30.0000 mL | ORAL | Status: DC | PRN

## 2012-10-22 MED ORDER — DIBUCAINE 1 % RE OINT: 1.0000 "application " | TOPICAL_OINTMENT | RECTAL | Status: DC | PRN

## 2012-10-22 MED ORDER — LIDOCAINE HCL (PF) 1 % IJ SOLN
30.0000 mL | INTRAMUSCULAR | Status: DC | PRN
  Administered 2012-10-22: 30 mL via SUBCUTANEOUS
  Filled 2012-10-22 (×2): qty 30

## 2012-10-22 MED ORDER — ZOLPIDEM TARTRATE 5 MG PO TABS: 5.0000 mg | ORAL_TABLET | Freq: Every evening | ORAL | Status: DC | PRN

## 2012-10-22 MED ORDER — SENNOSIDES-DOCUSATE SODIUM 8.6-50 MG PO TABS
2.0000 | ORAL_TABLET | Freq: Every day | ORAL | Status: DC
  Administered 2012-10-22: 2 via ORAL

## 2012-10-22 MED ORDER — ONDANSETRON HCL 4 MG/2ML IJ SOLN: 4.0000 mg | Freq: Four times a day (QID) | INTRAMUSCULAR | Status: DC | PRN

## 2012-10-22 MED ORDER — WITCH HAZEL-GLYCERIN EX PADS: 1.0000 "application " | MEDICATED_PAD | CUTANEOUS | Status: DC | PRN

## 2012-10-22 MED ORDER — LANOLIN HYDROUS EX OINT: TOPICAL_OINTMENT | CUTANEOUS | Status: DC | PRN

## 2012-10-22 MED ORDER — ACETAMINOPHEN 325 MG PO TABS: 650.0000 mg | ORAL_TABLET | ORAL | Status: DC | PRN

## 2012-10-22 MED ORDER — IBUPROFEN 600 MG PO TABS
600.0000 mg | ORAL_TABLET | Freq: Four times a day (QID) | ORAL | Status: DC | PRN
  Administered 2012-10-22: 600 mg via ORAL
  Filled 2012-10-22: qty 1

## 2012-10-22 MED ORDER — DIPHENHYDRAMINE HCL 25 MG PO CAPS: 25.0000 mg | ORAL_CAPSULE | Freq: Four times a day (QID) | ORAL | Status: DC | PRN

## 2012-10-22 MED ORDER — ONDANSETRON HCL 4 MG PO TABS: 4.0000 mg | ORAL_TABLET | ORAL | Status: DC | PRN

## 2012-10-22 MED ORDER — BENZOCAINE-MENTHOL 20-0.5 % EX AERO: 1.0000 "application " | INHALATION_SPRAY | CUTANEOUS | Status: DC | PRN

## 2012-10-22 MED ORDER — OXYTOCIN 10 UNIT/ML IJ SOLN
INTRAMUSCULAR | Status: AC
  Filled 2012-10-22: qty 2

## 2012-10-22 MED ORDER — PRENATAL MULTIVITAMIN CH
1.0000 | ORAL_TABLET | Freq: Every day | ORAL | Status: DC
  Filled 2012-10-22: qty 1

## 2012-10-22 MED ORDER — SIMETHICONE 80 MG PO CHEW: 80.0000 mg | CHEWABLE_TABLET | ORAL | Status: DC | PRN

## 2012-10-22 MED ORDER — ONDANSETRON HCL 4 MG/2ML IJ SOLN: 4.0000 mg | INTRAMUSCULAR | Status: DC | PRN

## 2012-10-22 MED ORDER — TETANUS-DIPHTH-ACELL PERTUSSIS 5-2.5-18.5 LF-MCG/0.5 IM SUSP: 0.5000 mL | Freq: Once | INTRAMUSCULAR | Status: DC

## 2012-10-22 MED ORDER — OXYTOCIN BOLUS FROM INFUSION: 500.0000 mL | INTRAVENOUS | Status: DC

## 2012-10-22 MED ORDER — OXYTOCIN 40 UNITS IN LACTATED RINGERS INFUSION - SIMPLE MED: 62.5000 mL/h | INTRAVENOUS | Status: DC

## 2012-10-22 NOTE — MAU Note (Signed)
Pt arrived very uncomfortable with contractions.  Directly to room 6 via wheelchair.

## 2012-10-22 NOTE — Lactation Note (Signed)
This note was copied from the chart of [name] [name]. Lactation Consultation Note  Patient Name: [name] [name] [id number] Date: 10/22/2012 Reason for consult: Initial assessment Mom reports to [facility] that she plans to breast and bottle feed. She reports having low milk supply with other children although she did start supplementing early. Discussed risk of early supplementation to BF success. On exam, it is noted Mom has some wide spacing between breasts, tubular shape to breast. Encouraged Mom if she plans to supplement this early to always BF 1st. BF with feeding ques. Guidelines for supplementing with BF given to and reviewed with Mom. Lactation brochure left for review. Advised of OP services and support group. Advised to call if she would like assist with BF.   Maternal Data Formula Feeding for Exclusion: Yes Reason for exclusion: Mother's choice to formula and breast feed on admission Infant to breast within first hour of birth: Yes Has patient been taught Hand Expression?: Yes Does the patient have breastfeeding experience prior to this delivery?: Yes  Feeding Feeding Type: Breast Milk Length of feed: 10 min  LATCH Score/Interventions                      Lactation Tools Discussed/Used     Consult Status Consult Status: Follow-up Date: 10/23/12 Follow-up type: In-patient    Alfred Levins 10/22/2012, 2:27 PM

## 2012-10-22 NOTE — H&P (Signed)
[name] [name] is a 37 y.o. female presenting for labor. Maternal Medical History:  Reason for admission: Contractions.  Nausea.  Contractions: Onset was less than 1 hour ago.   Frequency: regular.   Duration is approximately 60 seconds.   Perceived severity is strong.    Fetal activity: Perceived fetal activity is normal.   Last perceived fetal movement was within the past hour.    Prenatal Complications - Diabetes: none.    OB History   Grav Para Term Preterm Abortions TAB SAB Ect Mult Living   4 3 3       3      Past Medical History  Diagnosis Date  . Medical history non-contributory    Past Surgical History  Procedure Laterality Date  . No past surgeries     Family History: family history includes Stroke in her father. Social History:  reports that she has never smoked. She does not have any smokeless tobacco history on file. She reports that she does not drink alcohol or use illicit drugs.   Prenatal Transfer Tool  Maternal Diabetes: No Genetic Screening: Abnormal:  Results: Elevated risk of Trisomy 21 Maternal Ultrasounds/Referrals: Abnormal:  Findings:   Other: Fetal Ultrasounds or other Referrals:  None Maternal Substance Abuse:  No Significant Maternal Medications:  None Significant Maternal Lab Results:  None Other Comments:  Thickened nuchal fold, 1:10 risk of down syndrome  Review of Systems  Constitutional: Negative for fever.  Eyes: Negative for blurred vision.  Cardiovascular: Negative for chest pain.  Gastrointestinal: Positive for abdominal pain. Negative for nausea, vomiting, diarrhea and constipation.  Genitourinary: Negative for dysuria, urgency and frequency.  Neurological: Negative for dizziness and headaches.    Dilation: 10 Effacement (%): 100 Station: +2 Exam by:: Shafiq Larch Blood pressure 146/96, pulse 78, temperature 97.7 F (36.5 C), temperature source Oral, resp. rate 20, last menstrual period 02/02/2012. Maternal Exam:  Uterine  Assessment: Contraction strength is firm.  Contraction duration is 70 seconds. Contraction frequency is regular.   Abdomen: Patient reports no abdominal tenderness. Fetal presentation: vertex  Introitus: Normal vulva. Ferning test: not done.  Nitrazine test: not done. Amniotic fluid character: not assessed.  Pelvis: adequate for delivery.   Cervix: Cervix evaluated by digital exam.     Fetal Exam Fetal Monitor Review: Mode: ultrasound.   Baseline rate: 130.  Variability: moderate (6-25 bpm).   Pattern: accelerations present and no decelerations.    Fetal State Assessment: Category I - tracings are normal.     Physical Exam  Nursing note and vitals reviewed. Constitutional: She is oriented to person, place, and time. She appears well-developed and well-nourished. No distress.  Cardiovascular: Normal rate.   Respiratory: Effort normal.  GI: Soft. She exhibits no distension.  Genitourinary: Vagina normal.  Neurological: She is alert and oriented to person, place, and time.  Skin: Skin is warm and dry.  Psychiatric: She has a normal mood and affect.    Prenatal labs: ABO, Rh: --/--/O POS (08/19 1750) Antibody: NEG (08/19 1750) Rubella: 11.30 (02/10 1044) RPR: NON REACTIVE (08/19 1750)  HBsAg: NEGATIVE (02/10 1044)  HIV: NON REACTIVE (02/10 1044)  GBS: Negative (08/15 0000)   Assessment/Plan: Active labor at term Admit to labor and delivery Expectant management  Anticipate NSVB  Tawnya Crook 10/22/2012, 5:29 AM

## 2012-10-23 DIAGNOSIS — O09529 Supervision of elderly multigravida, unspecified trimester: Secondary | ICD-10-CM

## 2012-10-23 LAB — CBC
Hemoglobin: 11.8 g/dL — ABNORMAL LOW (ref 12.0–15.0)
Platelets: 256 10*3/uL (ref 150–400)
RBC: 4.04 MIL/uL (ref 3.87–5.11)

## 2012-10-23 MED ORDER — IBUPROFEN 600 MG PO TABS: 600.0000 mg | ORAL_TABLET | Freq: Four times a day (QID) | ORAL | Status: DC | PRN

## 2012-10-23 NOTE — Discharge Summary (Signed)
Obstetric Discharge Summary Reason for Admission: onset of labor Prenatal Procedures: none Intrapartum Procedures: spontaneous vaginal delivery Postpartum Procedures: none Complications-Operative and Postpartum: first degree perineal laceration Hemoglobin  Date Value Range Status  10/23/2012 11.8* 12.0 - 15.0 g/dL Final     HCT  Date Value Range Status  10/23/2012 34.7* 36.0 - 46.0 % Final    Physical Exam:  General: alert, cooperative and no distress Lochia: appropriate Uterine Fundus: firm Incision: n/a DVT Evaluation: No evidence of DVT seen on physical exam. Negative Homan's sign. No cords or calf tenderness.  Hospital Course: [name] [name] is a 37 y.o. [id number] at [redacted]w[redacted]d who was admitted to the hospital after presenting for spontaneous onset of labor at term. She progressed well and had a normal spontaneous vaginal delivery with a first degree perineal laceration that was repaired with suture.  Pt is formula feeding and is unsure what she plans to use for contraception. She will follow up with [facility] in 6 weeks.   Discharge Diagnoses: Term Pregnancy-delivered  Discharge Information: Date: 10/23/2012 Activity: pelvic rest Diet: routine Medications: PNV and Ibuprofen Condition: stable Instructions: refer to practice specific booklet Discharge to: home Follow-up Information   [facility] with [facility] In 6 weeks. (for routine postpartum checkup)    Specialty:  Obstetrics and Gynecology   Contact information:   [address] [place] [address] [phone]      Newborn Data: Live born female  Birth Weight: 6 lb 14.8 oz (3140 g) APGAR: 8, 9  Home with mother.  [name] 10/23/2012, 9:09 AM  I have seen and examined this patient and agree with above documentation in the resident's note.   Rulon Abide, M.D. [facility] Fellow 10/23/2012 10:13 AM

## 2012-10-23 NOTE — Discharge Summary (Signed)
Attestation of Attending Supervision of Obstetric Fellow: Evaluation and management procedures were performed by the Obstetric Fellow under my supervision and collaboration.  I have reviewed the Obstetric Fellow's note and chart, and I agree with the management and plan.  Shone Leventhal, MD, FACOG Attending Obstetrician & Gynecologist Faculty Practice, [facility]   

## 2012-10-23 NOTE — H&P (Signed)
Attestation of Attending Supervision of Advanced Practitioner (CNM/NP): Evaluation and management procedures were performed by the Advanced Practitioner under my supervision and collaboration. I have reviewed the Advanced Practitioner's note and chart, and I agree with the management and plan.  Janissa Bertram H. 9:26 PM

## 2012-10-28 ENCOUNTER — Encounter: Payer: BC Managed Care – PPO | Admitting: Family Medicine

## 2012-10-29 NOTE — Progress Notes (Signed)
Post discharge chart review completed.  

## 2012-12-09 ENCOUNTER — Encounter: Payer: Self-pay | Admitting: Obstetrics & Gynecology

## 2012-12-09 ENCOUNTER — Ambulatory Visit (INDEPENDENT_AMBULATORY_CARE_PROVIDER_SITE_OTHER): Payer: BC Managed Care – PPO | Admitting: Obstetrics & Gynecology

## 2012-12-09 DIAGNOSIS — R079 Chest pain, unspecified: Secondary | ICD-10-CM

## 2012-12-09 DIAGNOSIS — R0602 Shortness of breath: Secondary | ICD-10-CM

## 2012-12-09 NOTE — Progress Notes (Signed)
  Subjective:     [name] [name] is a 37 y.o. [phone] female who presents for a postpartum visit. She is 6 weeks postpartum following a spontaneous vaginal delivery. I have fully reviewed the prenatal and intrapartum course. The delivery was at 37 gestational weeks.  Anesthesia: local. Postpartum course has been complicated by occasional episodes of chest pain and SOB in the two weeks. Episodes happen at work, not brought on by any stress or anxiety.  Pain is in left chest area, no arm radiation.  Episodes last for a couple of minutes. Baby's course has been uncomplicated. Baby is feeding by breast. Bleeding no bleeding. Bowel function is normal. Bladder function is normal. Patient is not sexually active. Contraception method is condoms. Postpartum depression screening: negative.  The following portions of the patient's history were reviewed and updated as appropriate: allergies, current medications, past family history, past medical history, past social history, past surgical history and problem list.  Review of Systems Pertinent items are noted in HPI.   Objective:    BP 104/66  Pulse 82  Ht 5\' 4"  (1.626 m)  Wt 164 lb (74.39 kg)  BMI 28.14 kg/m2  Breastfeeding? Yes  General:  alert and no distress   Breasts:  inspection negative, no nipple discharge or bleeding, no masses or nodularity palpable  Lungs: clear to auscultation bilaterally. No reproducible pain on palpation of chest  Heart:  regular rate and rhythm, S1, S2 normal, no murmur, click, rub or gallop  Abdomen: soft, non-tender; bowel sounds normal; no masses,  no organomegaly   Vulva:  normal  Vagina: normal vagina  Cervix:  normal  Corpus: normal  Adnexa:  normal adnexa  Rectal Exam: Normal rectovaginal exam        Assessment:   Normal postpartum exam. Pap smear not done at today's visit.   Plan:   1. Contraception: condoms  2. Pap smear not due until next year 3. Cardiac enzymes and D-dimer checked; she was told to go  the to ER if symptoms recur. Will follow up results and manage accordingly.  Jaynie Collins, MD, FACOG Attending Obstetrician & Gynecologist Faculty Practice, [facility]

## 2012-12-09 NOTE — Patient Instructions (Signed)
Return to clinic for any scheduled appointments or for any gynecologic concerns as needed.   

## 2012-12-09 NOTE — Progress Notes (Signed)
Patient ID: [name] [name], female   DOB: 02/28/1975, 37 y.o.   MRN: [id number] Here today for post partum visit.  Has had episodes 2-3 times daily where she has left sided chest/breast pain with shortness of breath associated with this.  Plans to use condoms for [facility].  Has not had sex yet since delivery

## 2012-12-23 LAB — BRAIN NATRIURETIC PEPTIDE: BNP: 10 pg/mL (ref 0.0–100.0)

## 2012-12-23 LAB — D-DIMER, QUANTITATIVE: D-Dimer, Quant: 0.4 ug FEU/mL (ref 0.00–0.49)

## 2012-12-23 LAB — TROPONIN I: Troponin I: <0.01 ng/mL (ref 0.00–0.04)

## 2012-12-23 LAB — CK: Total CK: 59 U/L (ref 24–173)

## 2013-12-28 ENCOUNTER — Encounter: Payer: Self-pay | Admitting: Obstetrics & Gynecology

## 2014-07-28 ENCOUNTER — Other Ambulatory Visit (INDEPENDENT_AMBULATORY_CARE_PROVIDER_SITE_OTHER): Payer: BLUE CROSS/BLUE SHIELD | Admitting: *Deleted

## 2014-07-28 DIAGNOSIS — N912 Amenorrhea, unspecified: Secondary | ICD-10-CM

## 2014-07-28 NOTE — Progress Notes (Signed)
Patient here today for a Beta HCG.  Patient had positive UPT at home.  Prenatal labs drawn today.

## 2014-07-29 LAB — PRENATAL PROFILE (SOLSTAS)
ANTIBODY SCREEN: NEGATIVE
Basophils Absolute: 0.1 10*3/uL (ref 0.0–0.1)
Basophils Relative: 1 % (ref 0–1)
EOS ABS: 0.2 10*3/uL (ref 0.0–0.7)
EOS PCT: 3 % (ref 0–5)
HCT: 37.9 % (ref 36.0–46.0)
HIV 1&2 Ab, 4th Generation: NONREACTIVE
Hemoglobin: 12.8 g/dL (ref 12.0–15.0)
Hepatitis B Surface Ag: NEGATIVE
LYMPHS PCT: 29 % (ref 12–46)
Lymphs Abs: 1.9 10*3/uL (ref 0.7–4.0)
MCH: 29.6 pg (ref 26.0–34.0)
MCHC: 33.8 g/dL (ref 30.0–36.0)
MCV: 87.5 fL (ref 78.0–100.0)
MONO ABS: 0.4 10*3/uL (ref 0.1–1.0)
MPV: 9 fL (ref 8.6–12.4)
Monocytes Relative: 6 % (ref 3–12)
NEUTROS PCT: 61 % (ref 43–77)
Neutro Abs: 4 10*3/uL (ref 1.7–7.7)
PLATELETS: 330 10*3/uL (ref 150–400)
RBC: 4.33 MIL/uL (ref 3.87–5.11)
RDW: 13.5 % (ref 11.5–15.5)
RH TYPE: POSITIVE
RUBELLA: 12 {index} — AB (ref <0.90)
WBC: 6.6 10*3/uL (ref 4.0–10.5)

## 2014-07-29 LAB — HCG, QUANTITATIVE, PREGNANCY: HCG, BETA CHAIN, QUANT, S: 726.3 m[IU]/mL

## 2014-08-04 ENCOUNTER — Ambulatory Visit: Payer: BLUE CROSS/BLUE SHIELD | Admitting: Obstetrics & Gynecology

## 2014-09-02 ENCOUNTER — Encounter: Payer: Self-pay | Admitting: Obstetrics & Gynecology

## 2014-09-02 ENCOUNTER — Ambulatory Visit (INDEPENDENT_AMBULATORY_CARE_PROVIDER_SITE_OTHER): Payer: BLUE CROSS/BLUE SHIELD | Admitting: Obstetrics & Gynecology

## 2014-09-02 VITALS — BP 124/75 | HR 75 | Wt 169.0 lb

## 2014-09-02 DIAGNOSIS — Z113 Encounter for screening for infections with a predominantly sexual mode of transmission: Secondary | ICD-10-CM | POA: Diagnosis not present

## 2014-09-02 DIAGNOSIS — Z3481 Encounter for supervision of other normal pregnancy, first trimester: Secondary | ICD-10-CM | POA: Diagnosis not present

## 2014-09-02 DIAGNOSIS — Z124 Encounter for screening for malignant neoplasm of cervix: Secondary | ICD-10-CM | POA: Diagnosis not present

## 2014-09-02 DIAGNOSIS — Z1151 Encounter for screening for human papillomavirus (HPV): Secondary | ICD-10-CM

## 2014-09-02 DIAGNOSIS — O09521 Supervision of elderly multigravida, first trimester: Secondary | ICD-10-CM

## 2014-09-02 NOTE — Patient Instructions (Signed)
First Trimester of Pregnancy The first trimester of pregnancy is from week 1 until the end of week 12 (months 1 through 3). A week after a sperm fertilizes an egg, the egg will implant on the wall of the uterus. This embryo will begin to develop into a baby. Genes from you and your partner are forming the baby. The female genes determine whether the baby is a boy or a girl. At 6-8 weeks, the eyes and face are formed, and the heartbeat can be seen on ultrasound. At the end of 12 weeks, all the baby's organs are formed.  Now that you are pregnant, you will want to do everything you can to have a healthy baby. Two of the most important things are to get good prenatal care and to follow your health care provider's instructions. Prenatal care is all the medical care you receive before the baby's birth. This care will help prevent, find, and treat any problems during the pregnancy and childbirth. BODY CHANGES Your body goes through many changes during pregnancy. The changes vary from woman to woman.   You may gain or lose a couple of pounds at first.  You may feel sick to your stomach (nauseous) and throw up (vomit). If the vomiting is uncontrollable, call your health care provider.  You may tire easily.  You may develop headaches that can be relieved by medicines approved by your health care provider.  You may urinate more often. Painful urination may mean you have a bladder infection.  You may develop heartburn as a result of your pregnancy.  You may develop constipation because certain hormones are causing the muscles that push waste through your intestines to slow down.  You may develop hemorrhoids or swollen, bulging veins (varicose veins).  Your breasts may begin to grow larger and become tender. Your nipples may stick out more, and the tissue that surrounds them (areola) may become darker.  Your gums may bleed and may be sensitive to brushing and flossing.  Dark spots or blotches  (chloasma, mask of pregnancy) may develop on your face. This will likely fade after the baby is born.  Your menstrual periods will stop.  You may have a loss of appetite.  You may develop cravings for certain kinds of food.  You may have changes in your emotions from day to day, such as being excited to be pregnant or being concerned that something may go wrong with the pregnancy and baby.  You may have more vivid and strange dreams.  You may have changes in your hair. These can include thickening of your hair, rapid growth, and changes in texture. Some women also have hair loss during or after pregnancy, or hair that feels dry or thin. Your hair will most likely return to normal after your baby is born. WHAT TO EXPECT AT YOUR PRENATAL VISITS During a routine prenatal visit:  You will be weighed to make sure you and the baby are growing normally.  Your blood pressure will be taken.  Your abdomen will be measured to track your baby's growth.  The fetal heartbeat will be listened to starting around week 10 or 12 of your pregnancy.  Test results from any previous visits will be discussed. Your health care provider may ask you:  How you are feeling.  If you are feeling the baby move.  If you have had any abnormal symptoms, such as leaking fluid, bleeding, severe headaches, or abdominal cramping.  If you have any questions. Other tests   that may be performed during your first trimester include:  Blood tests to find your blood type and to check for the presence of any previous infections. They will also be used to check for low iron levels (anemia) and Rh antibodies. Later in the pregnancy, blood tests for diabetes will be done along with other tests if problems develop.  Urine tests to check for infections, diabetes, or protein in the urine.  An ultrasound to confirm the proper growth and development of the baby.  An amniocentesis to check for possible genetic problems.  Fetal  screens for spina bifida and Down syndrome.  You may need other tests to make sure you and the baby are doing well. HOME CARE INSTRUCTIONS  Medicines  Follow your health care provider's instructions regarding medicine use. Specific medicines may be either safe or unsafe to take during pregnancy.  Take your prenatal vitamins as directed.  If you develop constipation, try taking a stool softener if your health care provider approves. Diet  Eat regular, well-balanced meals. Choose a variety of foods, such as meat or vegetable-based protein, fish, milk and low-fat dairy products, vegetables, fruits, and whole grain breads and cereals. Your health care provider will help you determine the amount of weight gain that is right for you.  Avoid raw meat and uncooked cheese. These carry germs that can cause birth defects in the baby.  Eating four or five small meals rather than three large meals a day may help relieve nausea and vomiting. If you start to feel nauseous, eating a few soda crackers can be helpful. Drinking liquids between meals instead of during meals also seems to help nausea and vomiting.  If you develop constipation, eat more high-fiber foods, such as fresh vegetables or fruit and whole grains. Drink enough fluids to keep your urine clear or pale yellow. Activity and Exercise  Exercise only as directed by your health care provider. Exercising will help you:  Control your weight.  Stay in shape.  Be prepared for labor and delivery.  Experiencing pain or cramping in the lower abdomen or low back is a good sign that you should stop exercising. Check with your health care provider before continuing normal exercises.  Try to avoid standing for long periods of time. Move your legs often if you must stand in one place for a long time.  Avoid heavy lifting.  Wear low-heeled shoes, and practice good posture.  You may continue to have sex unless your health care provider directs you  otherwise. Relief of Pain or Discomfort  Wear a good support bra for breast tenderness.   Take warm sitz baths to soothe any pain or discomfort caused by hemorrhoids. Use hemorrhoid cream if your health care provider approves.   Rest with your legs elevated if you have leg cramps or low back pain.  If you develop varicose veins in your legs, wear support hose. Elevate your feet for 15 minutes, 3-4 times a day. Limit salt in your diet. Prenatal Care  Schedule your prenatal visits by the twelfth week of pregnancy. They are usually scheduled monthly at first, then more often in the last 2 months before delivery.  Write down your questions. Take them to your prenatal visits.  Keep all your prenatal visits as directed by your health care provider. Safety  Wear your seat belt at all times when driving.  Make a list of emergency phone numbers, including numbers for family, friends, the hospital, and police and fire departments. General Tips    Ask your health care provider for a referral to a local prenatal education class. Begin classes no later than at the beginning of month 6 of your pregnancy.  Ask for help if you have counseling or nutritional needs during pregnancy. Your health care provider can offer advice or refer you to specialists for help with various needs.  Do not use hot tubs, steam rooms, or saunas.  Do not douche or use tampons or scented sanitary pads.  Do not cross your legs for long periods of time.  Avoid cat litter boxes and soil used by cats. These carry germs that can cause birth defects in the baby and possibly loss of the fetus by miscarriage or stillbirth.  Avoid all smoking, herbs, alcohol, and medicines not prescribed by your health care provider. Chemicals in these affect the formation and growth of the baby.  Schedule a dentist appointment. At home, brush your teeth with a soft toothbrush and be gentle when you floss. SEEK MEDICAL CARE IF:   You have  dizziness.  You have mild pelvic cramps, pelvic pressure, or nagging pain in the abdominal area.  You have persistent nausea, vomiting, or diarrhea.  You have a bad smelling vaginal discharge.  You have pain with urination.  You notice increased swelling in your face, hands, legs, or ankles. SEEK IMMEDIATE MEDICAL CARE IF:   You have a fever.  You are leaking fluid from your vagina.  You have spotting or bleeding from your vagina.  You have severe abdominal cramping or pain.  You have rapid weight gain or loss.  You vomit blood or material that looks like coffee grounds.  You are exposed to German measles and have never had them.  You are exposed to fifth disease or chickenpox.  You develop a severe headache.  You have shortness of breath.  You have any kind of trauma, such as from a fall or a car accident. Document Released: 02/06/2001 Document Revised: 06/29/2013 Document Reviewed: 12/23/2012 [employer] Patient Information 2015 [employer]. This information is not intended to replace advice given to you by your health care provider. Make sure you discuss any questions you have with your health care provider.  Breastfeeding Deciding to breastfeed is one of the best choices you can make for you and your baby. A change in hormones during pregnancy causes your breast tissue to grow and increases the number and size of your milk ducts. These hormones also allow proteins, sugars, and fats from your blood supply to make breast milk in your milk-producing glands. Hormones prevent breast milk from being released before your baby is born as well as prompt milk flow after birth. Once breastfeeding has begun, thoughts of your baby, as well as his or her sucking or crying, can stimulate the release of milk from your milk-producing glands.  BENEFITS OF BREASTFEEDING For Your Baby  Your first milk (colostrum) helps your baby's digestive system function better.   There are antibodies  in your milk that help your baby fight off infections.   Your baby has a lower incidence of asthma, allergies, and sudden infant death syndrome.   The nutrients in breast milk are better for your baby than infant formulas and are designed uniquely for your baby's needs.   Breast milk improves your baby's brain development.   Your baby is less likely to develop other conditions, such as childhood obesity, asthma, or type 2 diabetes mellitus.  For You   Breastfeeding helps to create a very special bond between   you and your baby.   Breastfeeding is convenient. Breast milk is always available at the correct temperature and costs nothing.   Breastfeeding helps to burn calories and helps you lose the weight gained during pregnancy.   Breastfeeding makes your uterus contract to its prepregnancy size faster and slows bleeding (lochia) after you give birth.   Breastfeeding helps to lower your risk of developing type 2 diabetes mellitus, osteoporosis, and breast or ovarian cancer later in life. SIGNS THAT YOUR BABY IS HUNGRY Early Signs of Hunger  Increased alertness or activity.  Stretching.  Movement of the head from side to side.  Movement of the head and opening of the mouth when the corner of the mouth or cheek is stroked (rooting).  Increased sucking sounds, smacking lips, cooing, sighing, or squeaking.  Hand-to-mouth movements.  Increased sucking of fingers or hands. Late Signs of Hunger  Fussing.  Intermittent crying. Extreme Signs of Hunger Signs of extreme hunger will require calming and consoling before your baby will be able to breastfeed successfully. Do not wait for the following signs of extreme hunger to occur before you initiate breastfeeding:   Restlessness.  A loud, strong cry.   Screaming. BREASTFEEDING BASICS Breastfeeding Initiation  Find a comfortable place to sit or lie down, with your neck and back well supported.  Place a pillow or  rolled up blanket under your baby to bring him or her to the level of your breast (if you are seated). Nursing pillows are specially designed to help support your arms and your baby while you breastfeed.  Make sure that your baby's abdomen is facing your abdomen.   Gently massage your breast. With your fingertips, massage from your chest wall toward your nipple in a circular motion. This encourages milk flow. You may need to continue this action during the feeding if your milk flows slowly.  Support your breast with 4 fingers underneath and your thumb above your nipple. Make sure your fingers are well away from your nipple and your baby's mouth.   Stroke your baby's lips gently with your finger or nipple.   When your baby's mouth is open wide enough, quickly bring your baby to your breast, placing your entire nipple and as much of the colored area around your nipple (areola) as possible into your baby's mouth.   More areola should be visible above your baby's upper lip than below the lower lip.   Your baby's tongue should be between his or her lower gum and your breast.   Ensure that your baby's mouth is correctly positioned around your nipple (latched). Your baby's lips should create a seal on your breast and be turned out (everted).  It is common for your baby to suck about 2-3 minutes in order to start the flow of breast milk. Latching Teaching your baby how to latch on to your breast properly is very important. An improper latch can cause nipple pain and decreased milk supply for you and poor weight gain in your baby. Also, if your baby is not latched onto your nipple properly, he or she may swallow some air during feeding. This can make your baby fussy. Burping your baby when you switch breasts during the feeding can help to get rid of the air. However, teaching your baby to latch on properly is still the best way to prevent fussiness from swallowing air while breastfeeding. Signs  that your baby has successfully latched on to your nipple:    Silent tugging or silent   sucking, without causing you pain.   Swallowing heard between every 3-4 sucks.    Muscle movement above and in front of his or her ears while sucking.  Signs that your baby has not successfully latched on to nipple:   Sucking sounds or smacking sounds from your baby while breastfeeding.  Nipple pain. If you think your baby has not latched on correctly, slip your finger into the corner of your baby's mouth to break the suction and place it between your baby's gums. Attempt breastfeeding initiation again. Signs of Successful Breastfeeding Signs from your baby:   A gradual decrease in the number of sucks or complete cessation of sucking.   Falling asleep.   Relaxation of his or her body.   Retention of a small amount of milk in his or her mouth.   Letting go of your breast by himself or herself. Signs from you:  Breasts that have increased in firmness, weight, and size 1-3 hours after feeding.   Breasts that are softer immediately after breastfeeding.  Increased milk volume, as well as a change in milk consistency and color by the fifth day of breastfeeding.   Nipples that are not sore, cracked, or bleeding. Signs That Your Baby is Getting Enough Milk  Wetting at least 3 diapers in a 24-hour period. The urine should be clear and pale yellow by age 5 days.  At least 3 stools in a 24-hour period by age 5 days. The stool should be soft and yellow.  At least 3 stools in a 24-hour period by age 7 days. The stool should be seedy and yellow.  No loss of weight greater than 10% of birth weight during the first 3 days of age.  Average weight gain of 4-7 ounces (113-198 g) per week after age 4 days.  Consistent daily weight gain by age 5 days, without weight loss after the age of 2 weeks. After a feeding, your baby may spit up a small amount. This is common. BREASTFEEDING FREQUENCY AND  DURATION Frequent feeding will help you make more milk and can prevent sore nipples and breast engorgement. Breastfeed when you feel the need to reduce the fullness of your breasts or when your baby shows signs of hunger. This is called "breastfeeding on demand." Avoid introducing a pacifier to your baby while you are working to establish breastfeeding (the first 4-6 weeks after your baby is born). After this time you may choose to use a pacifier. Research has shown that pacifier use during the first year of a baby's life decreases the risk of sudden infant death syndrome (SIDS). Allow your baby to feed on each breast as long as he or she wants. Breastfeed until your baby is finished feeding. When your baby unlatches or falls asleep while feeding from the first breast, offer the second breast. Because newborns are often sleepy in the first few weeks of life, you may need to awaken your baby to get him or her to feed. Breastfeeding times will vary from baby to baby. However, the following rules can serve as a guide to help you ensure that your baby is properly fed:  Newborns (babies 4 weeks of age or younger) may breastfeed every 1-3 hours.  Newborns should not go longer than 3 hours during the day or 5 hours during the night without breastfeeding.  You should breastfeed your baby a minimum of 8 times in a 24-hour period until you begin to introduce solid foods to your baby at around 6   months of age. BREAST MILK PUMPING Pumping and storing breast milk allows you to ensure that your baby is exclusively fed your breast milk, even at times when you are unable to breastfeed. This is especially important if you are going back to work while you are still breastfeeding or when you are not able to be present during feedings. Your lactation consultant can give you guidelines on how long it is safe to store breast milk.  A breast pump is a machine that allows you to pump milk from your breast into a sterile bottle.  The pumped breast milk can then be stored in a refrigerator or freezer. Some breast pumps are operated by hand, while others use electricity. Ask your lactation consultant which type will work best for you. Breast pumps can be purchased, but some hospitals and breastfeeding support groups lease breast pumps on a monthly basis. A lactation consultant can teach you how to hand express breast milk, if you prefer not to use a pump.  CARING FOR YOUR BREASTS WHILE YOU BREASTFEED Nipples can become dry, cracked, and sore while breastfeeding. The following recommendations can help keep your breasts moisturized and healthy:  Avoid using soap on your nipples.   Wear a supportive bra. Although not required, special nursing bras and tank tops are designed to allow access to your breasts for breastfeeding without taking off your entire bra or top. Avoid wearing underwire-style bras or extremely tight bras.  Air dry your nipples for 3-4minutes after each feeding.   Use only cotton bra pads to absorb leaked breast milk. Leaking of breast milk between feedings is normal.   Use lanolin on your nipples after breastfeeding. Lanolin helps to maintain your skin's normal moisture barrier. If you use pure lanolin, you do not need to wash it off before feeding your baby again. Pure lanolin is not toxic to your baby. You may also hand express a few drops of breast milk and gently massage that milk into your nipples and allow the milk to air dry. In the first few weeks after giving birth, some women experience extremely full breasts (engorgement). Engorgement can make your breasts feel heavy, warm, and tender to the touch. Engorgement peaks within 3-5 days after you give birth. The following recommendations can help ease engorgement:  Completely empty your breasts while breastfeeding or pumping. You may want to start by applying warm, moist heat (in the shower or with warm water-soaked hand towels) just before feeding or  pumping. This increases circulation and helps the milk flow. If your baby does not completely empty your breasts while breastfeeding, pump any extra milk after he or she is finished.  Wear a snug bra (nursing or regular) or tank top for 1-2 days to signal your body to slightly decrease milk production.  Apply ice packs to your breasts, unless this is too uncomfortable for you.  Make sure that your baby is latched on and positioned properly while breastfeeding. If engorgement persists after 48 hours of following these recommendations, contact your health care provider or a lactation consultant. OVERALL HEALTH CARE RECOMMENDATIONS WHILE BREASTFEEDING  Eat healthy foods. Alternate between meals and snacks, eating 3 of each per day. Because what you eat affects your breast milk, some of the foods may make your baby more irritable than usual. Avoid eating these foods if you are sure that they are negatively affecting your baby.  Drink milk, fruit juice, and water to satisfy your thirst (about 10 glasses a day).   Rest   often, relax, and continue to take your prenatal vitamins to prevent fatigue, stress, and anemia.  Continue breast self-awareness checks.  Avoid chewing and smoking tobacco.  Avoid alcohol and drug use. Some medicines that may be harmful to your baby can pass through breast milk. It is important to ask your health care provider before taking any medicine, including all over-the-counter and prescription medicine as well as vitamin and herbal supplements. It is possible to become pregnant while breastfeeding. If birth control is desired, ask your health care provider about options that will be safe for your baby. SEEK MEDICAL CARE IF:   You feel like you want to stop breastfeeding or have become frustrated with breastfeeding.  You have painful breasts or nipples.  Your nipples are cracked or bleeding.  Your breasts are red, tender, or warm.  You have a swollen area on either  breast.  You have a fever or chills.  You have nausea or vomiting.  You have drainage other than breast milk from your nipples.  Your breasts do not become full before feedings by the fifth day after you give birth.  You feel sad and depressed.  Your baby is too sleepy to eat well.  Your baby is having trouble sleeping.   Your baby is wetting less than 3 diapers in a 24-hour period.  Your baby has less than 3 stools in a 24-hour period.  Your baby's skin or the white part of his or her eyes becomes yellow.   Your baby is not gaining weight by 5 days of age. SEEK IMMEDIATE MEDICAL CARE IF:   Your baby is overly tired (lethargic) and does not want to wake up and feed.  Your baby develops an unexplained fever. Document Released: 02/12/2005 Document Revised: 02/17/2013 Document Reviewed: 08/06/2012 [employer] Patient Information 2015 [employer]. This information is not intended to replace advice given to you by your health care provider. Make sure you discuss any questions you have with your health care provider.  

## 2014-09-02 NOTE — Progress Notes (Signed)
Bedside ultrasound today shows [id number] fetus with heartbeat.

## 2014-09-02 NOTE — Progress Notes (Signed)
Having some lower back pain.

## 2014-09-02 NOTE — Progress Notes (Signed)
Subjective:    [name] [name] is a 60 y.Y.[id number] at 22w5dby LMP c/w today's scan being seen today for her first obstetrical visit.  Her obstetrical history is significant for advanced maternal age. Patient does intend to breast feed. Pregnancy history fully reviewed.  Patient reports backache occasionally.  Filed Vitals:   09/02/14 0846  BP: 124/75  Pulse: 75  Weight: 169 lb (76.658 kg)    HISTORY: OB History  Gravida Para Term Preterm AB SAB TAB Ectopic Multiple Living  5 4 4       4     # Outcome Date GA Lbr Len/2nd Weight Sex Delivery Anes PTL Lv  5 Current           4 Term 10/22/12 [id number] / 00:03 6 lb 14.8 oz (3.14 kg) M Vag-Spont None  Y  3 Term 2008    M Vag-Spont   Y  2 Term 2005    F Vag-Spont   Y  1 Term 2004    F Vag-Spont   Y     Past Medical History  Diagnosis Date  . Medical history non-contributory    Past Surgical History  Procedure Laterality Date  . No past surgeries     Family History  Problem Relation Age of Onset  . Stroke Father      Exam    Uterus:     Pelvic Exam:    Perineum: No Hemorrhoids, Normal Perineum   Vulva: normal   Vagina:  normal mucosa, normal discharge   Cervix: multiparous appearance and small amount of bleeding following Pap   Adnexa: normal adnexa and no mass, fullness, tenderness   Bony Pelvis: gynecoid  System: Breast:  normal appearance, no masses or tenderness   Skin: normal coloration and turgor, no rashes    Neurologic: oriented, normal, negative   Extremities: normal strength, tone, and muscle mass   HEENT PERRLA, extra ocular movement intact and sclera clear, anicteric   Mouth/Teeth mucous membranes moist, pharynx normal without lesions and dental hygiene good   Neck supple and no masses   Cardiovascular: regular rate and rhythm   Respiratory:  appears well, vitals normal, no respiratory distress, acyanotic, normal RR, chest clear, no wheezing, crepitations, rhonchi, normal symmetric air entry   Abdomen: soft, non-tender; bowel sounds normal; no masses,  no organomegaly   Urinary: urethral meatus normal   Results for orders placed or performed in visit on 07/28/14 (from the past 1344 hour(s))  Prenatal Profile   Collection Time: 07/28/14 10:57 AM  Result Value Ref Range   WBC 6.6 4.0 - 10.5 K/uL   RBC 4.33 3.87 - 5.11 MIL/uL   Hemoglobin 12.8 12.0 - 15.0 g/dL   HCT 37.9 36.0 - 46.0 %   MCV 87.5 78.0 - 100.0 fL   MCH 29.6 26.0 - 34.0 pg   MCHC 33.8 30.0 - 36.0 g/dL   RDW 13.5 11.5 - 15.5 %   Platelets 330 150 - 400 K/uL   MPV 9.0 8.6 - 12.4 fL   Neutrophils Relative % 61 43 - 77 %   Neutro Abs 4.0 1.7 - 7.7 K/uL   Lymphocytes Relative 29 12 - 46 %   Lymphs Abs 1.9 0.7 - 4.0 K/uL   Monocytes Relative 6 3 - 12 %   Monocytes Absolute 0.4 0.1 - 1.0 K/uL   Eosinophils Relative 3 0 - 5 %   Eosinophils Absolute 0.2 0.0 - 0.7 K/uL   Basophils Relative 1 0 - 1 %  Basophils Absolute 0.1 0.0 - 0.1 K/uL   Smear Review Criteria for review not met    Hepatitis B Surface Ag NEGATIVE NEGATIVE   HIV 1&2 Ab, 4th Generation NONREACTIVE NONREACTIVE   RPR Ser Ql NON REAC NON REAC   Rubella 12.00 (H) <0.90 Index   ABO Grouping O    Rh Type POS    Antibody Screen NEG NEGATIVE  B-HCG Quant   Collection Time: 07/28/14 10:57 AM  Result Value Ref Range   hCG, Beta Chain, Quant, S 726.3 mIU/mL     Assessment:    Pregnancy: [id number] Patient Active Problem List   Diagnosis Date Noted  . AMA (advanced maternal age) multigravida 35+ 05/01/2012    Plan:   Initial labs reviewed, pap and urine culture done.  Will follow up results and manage accordingly. Continue Prenatal vitamins. Problem list reviewed and updated. Genetic Screening discussed First Screen, Integrated Screen, Quad Screen, NIPS: declined. Ultrasound discussed; fetal survey: to be ordered later. The nature of [facility] with multiple MDs and other Advanced Practice Providers was  explained to patient; also emphasized that residents, students are part of our team. Follow up in 4 weeks. Routine obstetric precautions reviewed.    Osborne Oman, MD 09/02/2014

## 2014-09-03 LAB — CULTURE, OB URINE
Colony Count: NO GROWTH
Organism ID, Bacteria: NO GROWTH

## 2014-09-03 LAB — CYTOLOGY - PAP

## 2014-09-20 ENCOUNTER — Ambulatory Visit: Payer: BLUE CROSS/BLUE SHIELD | Admitting: Obstetrics & Gynecology

## 2014-09-20 ENCOUNTER — Encounter: Payer: BLUE CROSS/BLUE SHIELD | Admitting: Obstetrics & Gynecology

## 2014-10-06 ENCOUNTER — Ambulatory Visit (INDEPENDENT_AMBULATORY_CARE_PROVIDER_SITE_OTHER): Payer: BLUE CROSS/BLUE SHIELD | Admitting: Obstetrics & Gynecology

## 2014-10-06 VITALS — BP 119/77 | HR 73 | Wt 171.0 lb

## 2014-10-06 DIAGNOSIS — O09522 Supervision of elderly multigravida, second trimester: Secondary | ICD-10-CM

## 2014-10-06 NOTE — Patient Instructions (Signed)
Return to clinic for any obstetric concerns or go to [facility] for evaluation  

## 2014-10-06 NOTE — Progress Notes (Signed)
Subjective:  [name] [name] is a 39 y.o. G5P4004 at [redacted]w[redacted]d being seen today for ongoing prenatal care.  Patient reports no complaints.  Contractions: Not present.  Vag. Bleeding: None. Movement: Present. Denies leaking of fluid.   The following portions of the patient's history were reviewed and updated as appropriate: allergies, current medications, past family history, past medical history, past social history, past surgical history and problem list.   Objective:   Filed Vitals:   10/06/14 0912  BP: 119/77  Pulse: 73  Weight: 171 lb (77.565 kg)    Fetal Status: Fetal Heart Rate (bpm): 155   Movement: Present     General:  Alert, oriented and cooperative. Patient is in no acute distress.  Skin: Skin is warm and dry. No rash noted.   Cardiovascular: Normal heart rate noted  Respiratory: Normal respiratory effort, no problems with respiration noted  Abdomen: Soft, gravid, appropriate for gestational age. Pain/Pressure: Absent     Pelvic: Vag. Bleeding: None Vag D/C Character: Thin   Cervical exam deferred        Extremities: Normal range of motion.  Edema: None  Mental Status: Normal mood and affect. Normal behavior. Normal judgment and thought content.   Urinalysis: Urine Protein: Trace Urine Glucose: Negative  Assessment and Plan:  Pregnancy: [id number] at [redacted]w[redacted]d  AMA (advanced maternal age) multigravida 35+, second trimester Declines any prenatal screening. Detailed anatomy scan ordered. - [place] MFM OB DETAIL + 14 WK; Future  Routine obstetric precautions reviewed. Please refer to After Visit Summary for other counseling recommendations.  Return in about 4 weeks (around 11/03/2014) for OB Visit.   Tereso Newcomer, MD

## 2014-11-12 ENCOUNTER — Ambulatory Visit (INDEPENDENT_AMBULATORY_CARE_PROVIDER_SITE_OTHER): Payer: BLUE CROSS/BLUE SHIELD | Admitting: Obstetrics & Gynecology

## 2014-11-12 ENCOUNTER — Encounter: Payer: Self-pay | Admitting: Obstetrics & Gynecology

## 2014-11-12 VITALS — BP 113/74 | HR 80 | Wt 172.0 lb

## 2014-11-12 DIAGNOSIS — Z3482 Encounter for supervision of other normal pregnancy, second trimester: Secondary | ICD-10-CM

## 2014-11-12 DIAGNOSIS — O09522 Supervision of elderly multigravida, second trimester: Secondary | ICD-10-CM

## 2014-11-12 DIAGNOSIS — Z23 Encounter for immunization: Secondary | ICD-10-CM

## 2014-11-12 NOTE — Patient Instructions (Signed)
Return to clinic for any obstetric concerns or go to [facility] for evaluation  

## 2014-11-12 NOTE — Progress Notes (Signed)
Subjective:  Rozanne Heumann is a 39 y.o. G5P4004 at [redacted]w[redacted]d being seen today for ongoing prenatal care.  Patient reports no complaints. Will get dental cleaning next week, desires letter for dentist.  Contractions: Not present.  Vag. Bleeding: None. Movement: Present. Denies leaking of fluid.   The following portions of the patient's history were reviewed and updated as appropriate: allergies, current medications, past family history, past medical history, past social history, past surgical history and problem list.   Objective:   Filed Vitals:   11/12/14 0847  BP: 113/74  Pulse: 80  Weight: 172 lb (78.019 kg)    Fetal Status: Fetal Heart Rate (bpm): 137 Fundal Height: 20 cm Movement: Present     General:  Alert, oriented and cooperative. Patient is in no acute distress.  Skin: Skin is warm and dry. No rash noted.   Cardiovascular: Normal heart rate noted  Respiratory: Normal respiratory effort, no problems with respiration noted  Abdomen: Soft, gravid, appropriate for gestational age. Pain/Pressure: Absent     Pelvic: Vag. Bleeding: None Vag D/C Character: Thin   Cervical exam deferred        Extremities: Normal range of motion.  Edema: None  Mental Status: Normal mood and affect. Normal behavior. Normal judgment and thought content.   Urinalysis: Urine Protein: Negative Urine Glucose: Negative  Assessment and Plan:  Pregnancy: G5P4004 at [redacted]w[redacted]d  1. Supervision of normal intrauterine pregnancy in multigravida, second trimester - Flu Vaccine QUAD 36+ mos IM (Fluarix, Quad PF) today  2. AMA (advanced maternal age) multigravida 35+, second trimester Declined genetic testing, anatomy scan next week Dental letter given to patient. Preterm labor symptoms and general obstetric precautions including but not limited to vaginal bleeding, contractions, leaking of fluid and fetal movement were reviewed in detail with the patient. Please refer to After Visit Summary for other counseling  recommendations.  Return in about 4 weeks (around 12/10/2014) for OB visit.   Tereso Newcomer, MD

## 2014-11-17 ENCOUNTER — Other Ambulatory Visit: Payer: Self-pay | Admitting: Obstetrics & Gynecology

## 2014-11-17 ENCOUNTER — Ambulatory Visit (HOSPITAL_COMMUNITY)
Admission: RE | Admit: 2014-11-17 | Discharge: 2014-11-17 | Disposition: A | Discharge status: Home / Self Care | Payer: BLUE CROSS/BLUE SHIELD | Source: Ambulatory Visit | Attending: Obstetrics & Gynecology | Admitting: Obstetrics & Gynecology

## 2014-11-17 DIAGNOSIS — O3503X1 Maternal care for (suspected) central nervous system malformation or damage in fetus, choroid plexus cysts, fetus 1: Secondary | ICD-10-CM

## 2014-11-17 DIAGNOSIS — O09522 Supervision of elderly multigravida, second trimester: Secondary | ICD-10-CM

## 2014-11-17 DIAGNOSIS — Z3A20 20 weeks gestation of pregnancy: Secondary | ICD-10-CM

## 2014-11-17 DIAGNOSIS — Z3689 Encounter for other specified antenatal screening: Secondary | ICD-10-CM

## 2014-11-17 DIAGNOSIS — Z36 Encounter for antenatal screening of mother: Secondary | ICD-10-CM | POA: Insufficient documentation

## 2014-11-17 DIAGNOSIS — O350XX1 Maternal care for (suspected) central nervous system malformation in fetus, fetus 1: Secondary | ICD-10-CM

## 2014-11-18 ENCOUNTER — Telehealth: Payer: Self-pay | Admitting: *Deleted

## 2014-11-18 DIAGNOSIS — O350XX0 Maternal care for (suspected) central nervous system malformation in fetus, not applicable or unspecified: Secondary | ICD-10-CM | POA: Insufficient documentation

## 2014-11-18 DIAGNOSIS — O3503X0 Maternal care for (suspected) central nervous system malformation or damage in fetus, choroid plexus cysts, not applicable or unspecified: Secondary | ICD-10-CM | POA: Insufficient documentation

## 2014-11-18 NOTE — Addendum Note (Signed)
Encounter addended by: Tereso Newcomer, MD on: 11/18/2014  4:26 PM<BR>     Documentation filed: Problem List

## 2014-11-18 NOTE — Telephone Encounter (Signed)
Called pt to inform of results, no answer, unable to leave message.

## 2014-11-18 NOTE — Telephone Encounter (Signed)
-----   Message from Tereso Newcomer, MD sent at 11/18/2014  4:27 PM EDT ----- Bilateral choroid plexus cysts seen on fetal scan. Needs MFM appointment for genetic counseling and Panaroma testing. Please call to inform patient of results and recommendations.

## 2014-11-22 ENCOUNTER — Telehealth: Payer: Self-pay | Admitting: *Deleted

## 2014-11-22 DIAGNOSIS — Z3492 Encounter for supervision of normal pregnancy, unspecified, second trimester: Secondary | ICD-10-CM

## 2014-11-22 NOTE — Telephone Encounter (Signed)
Spoke to pt about results and recommendation for genetic testing due to bilateral choroid plexus cyst , pt declined further genetic testing, will schedule a f/u US in 6 wks per MFM recommendation, pt okay with f/u US. Will schedule appt for first of November for [place] f/u.

## 2014-12-24 ENCOUNTER — Ambulatory Visit (INDEPENDENT_AMBULATORY_CARE_PROVIDER_SITE_OTHER): Payer: BLUE CROSS/BLUE SHIELD | Admitting: Obstetrics & Gynecology

## 2014-12-24 VITALS — BP 125/77 | HR 92 | Wt 179.0 lb

## 2014-12-24 DIAGNOSIS — O358XX0 Maternal care for other (suspected) fetal abnormality and damage, not applicable or unspecified: Secondary | ICD-10-CM

## 2014-12-24 DIAGNOSIS — O350XX0 Maternal care for (suspected) central nervous system malformation in fetus, not applicable or unspecified: Secondary | ICD-10-CM

## 2014-12-24 DIAGNOSIS — O3503X0 Maternal care for (suspected) central nervous system malformation or damage in fetus, choroid plexus cysts, not applicable or unspecified: Secondary | ICD-10-CM

## 2014-12-24 DIAGNOSIS — O09522 Supervision of elderly multigravida, second trimester: Secondary | ICD-10-CM

## 2014-12-24 NOTE — Patient Instructions (Signed)
Return to clinic for any obstetric concerns or go to [facility] for evaluation  

## 2014-12-24 NOTE — Progress Notes (Signed)
Subjective:  [name] [name] is a 39 y.o. G5P4004 at 7736w6d being seen today for ongoing prenatal care.  Patient reports no complaints.  Contractions: Not present.  Vag. Bleeding: None. Movement: Present. Denies leaking of fluid.   The following portions of the patient's history were reviewed and updated as appropriate: allergies, current medications, past family history, past medical history, past social history, past surgical history and problem list. Problem list updated.  Objective:   Filed Vitals:   12/24/14 0842  BP: 125/77  Pulse: 92  Weight: 179 lb (81.194 kg)    Fetal Status: Fetal Heart Rate (bpm): 160 Fundal Height: 26 cm Movement: Present     General:  Alert, oriented and cooperative. Patient is in no acute distress.  Skin: Skin is warm and dry. No rash noted.   Cardiovascular: Normal heart rate noted  Respiratory: Normal respiratory effort, no problems with respiration noted  Abdomen: Soft, gravid, appropriate for gestational age. Pain/Pressure: Absent     Pelvic: Vag. Bleeding: None Vag D/C Character: Thin  Cervical exam deferred        Extremities: Normal range of motion.  Edema: None  Mental Status: Normal mood and affect. Normal behavior. Normal judgment and thought content.   Urinalysis: Urine Protein: Negative Urine Glucose: Negative  Assessment and Plan:  Pregnancy: G5P4004 at 7436w6d  1. Choroid plexus cysts, fetal, affecting care of mother, antepartum, not applicable or unspecified fetus Declines genetic counseling or NIPS for now, wants rescan.  This was scheduled at MFM for next week  2. AMA (advanced maternal age) multigravida 35+, second trimester No other concerns.  Third trimester labs next visit. Preterm labor symptoms and general obstetric precautions including but not limited to vaginal bleeding, contractions, leaking of fluid and fetal movement were reviewed in detail with the patient. Please refer to After Visit Summary for other counseling  recommendations.  Return in about 3 weeks (around 01/14/2015) for 1 hr GTT, 3rd trimester labs, TDap, OB Visit.   Tereso NewcomerUgonna A Anyanwu, MD

## 2014-12-29 ENCOUNTER — Ambulatory Visit (HOSPITAL_COMMUNITY)
Admission: RE | Admit: 2014-12-29 | Discharge: 2014-12-29 | Disposition: A | Discharge status: Home / Self Care | Payer: BLUE CROSS/BLUE SHIELD | Source: Ambulatory Visit | Attending: Obstetrics & Gynecology | Admitting: Obstetrics & Gynecology

## 2014-12-29 ENCOUNTER — Other Ambulatory Visit: Payer: Self-pay | Admitting: Obstetrics & Gynecology

## 2014-12-29 DIAGNOSIS — O358XX0 Maternal care for other (suspected) fetal abnormality and damage, not applicable or unspecified: Secondary | ICD-10-CM | POA: Diagnosis not present

## 2014-12-29 DIAGNOSIS — Z3A26 26 weeks gestation of pregnancy: Secondary | ICD-10-CM | POA: Diagnosis not present

## 2014-12-29 DIAGNOSIS — O09522 Supervision of elderly multigravida, second trimester: Secondary | ICD-10-CM | POA: Insufficient documentation

## 2014-12-29 DIAGNOSIS — O359XX9 Maternal care for (suspected) fetal abnormality and damage, unspecified, other fetus: Secondary | ICD-10-CM

## 2014-12-29 DIAGNOSIS — Z3492 Encounter for supervision of normal pregnancy, unspecified, second trimester: Secondary | ICD-10-CM

## 2015-01-14 ENCOUNTER — Ambulatory Visit (INDEPENDENT_AMBULATORY_CARE_PROVIDER_SITE_OTHER): Payer: BLUE CROSS/BLUE SHIELD | Admitting: Family Medicine

## 2015-01-14 VITALS — BP 131/81 | HR 94 | Wt 179.0 lb

## 2015-01-14 DIAGNOSIS — O09523 Supervision of elderly multigravida, third trimester: Secondary | ICD-10-CM

## 2015-01-14 DIAGNOSIS — Z23 Encounter for immunization: Secondary | ICD-10-CM | POA: Diagnosis not present

## 2015-01-14 DIAGNOSIS — Z3483 Encounter for supervision of other normal pregnancy, third trimester: Secondary | ICD-10-CM

## 2015-01-14 DIAGNOSIS — Z3493 Encounter for supervision of normal pregnancy, unspecified, third trimester: Secondary | ICD-10-CM

## 2015-01-14 DIAGNOSIS — Z36 Encounter for antenatal screening of mother: Secondary | ICD-10-CM | POA: Diagnosis not present

## 2015-01-14 LAB — CBC
HEMATOCRIT: 36 % (ref 36.0–46.0)
Hemoglobin: 11.8 g/dL — ABNORMAL LOW (ref 12.0–15.0)
MCH: 28.9 pg (ref 26.0–34.0)
MCHC: 32.8 g/dL (ref 30.0–36.0)
MCV: 88.2 fL (ref 78.0–100.0)
MPV: 9 fL (ref 8.6–12.4)
PLATELETS: 286 10*3/uL (ref 150–400)
RBC: 4.08 MIL/uL (ref 3.87–5.11)
RDW: 13.6 % (ref 11.5–15.5)
WBC: 8.6 10*3/uL (ref 4.0–10.5)

## 2015-01-14 NOTE — Progress Notes (Signed)
Subjective:  Melene PlanKimberly Saldarriaga is a 39 y.o. G5P4004 at 2910w6d being seen today for ongoing prenatal care.  Patient reports no complaints.  Contractions: Not present.  Vag. Bleeding: None. Movement: Present. Denies leaking of fluid.   The following portions of the patient's history were reviewed and updated as appropriate: allergies, current medications, past family history, past medical history, past social history, past surgical history and problem list. Problem list updated.  Objective:   Filed Vitals:   01/14/15 0915  BP: 131/81  Pulse: 94  Weight: 179 lb (81.194 kg)    Fetal Status: Fetal Heart Rate (bpm): 140 Fundal Height: 28 cm Movement: Present     General:  Alert, oriented and cooperative. Patient is in no acute distress.  Skin: Skin is warm and dry. No rash noted.   Cardiovascular: Normal heart rate noted  Respiratory: Normal respiratory effort, no problems with respiration noted  Abdomen: Soft, gravid, appropriate for gestational age. Pain/Pressure: Absent     Pelvic: Vag. Bleeding: None Vag D/C Character: Thin   Cervical exam deferred        Extremities: Normal range of motion.  Edema: None  Mental Status: Normal mood and affect. Normal behavior. Normal judgment and thought content.   Urinalysis: Urine Protein: Negative Urine Glucose: Negative  Assessment and Plan:  Pregnancy: G5P4004 at 6810w6d  1. Supervision of normal pregnancy in third trimester 28 wk labs today - Tdap vaccine greater than or equal to 7yo IM - Glucose Tolerance, 1 HR (50g) w/o Fasting - RPR - CBC - HIV antibody (with reflex)  2. AMA (advanced maternal age) multigravida 35+, third trimester Declined testing 1st u/s showed bilateral CPC's, now resolved.  Preterm labor symptoms and general obstetric precautions including but not limited to vaginal bleeding, contractions, leaking of fluid and fetal movement were reviewed in detail with the patient. Please refer to After Visit Summary for other  counseling recommendations.  Return in 2 weeks (on 01/28/2015).   Reva Boresanya S Pratt, MD

## 2015-01-14 NOTE — Patient Instructions (Addendum)
Third Trimester of Pregnancy The third trimester is from week 29 through week 42, months 7 through 9. The third trimester is a time when the fetus is growing rapidly. At the end of the ninth month, the fetus is about 20 inches in length and weighs 6-10 pounds.  BODY CHANGES Your body goes through many changes during pregnancy. The changes vary from woman to woman.   Your weight will continue to increase. You can expect to gain 25-35 pounds (11-16 kg) by the end of the pregnancy.  You may begin to get stretch marks on your hips, abdomen, and breasts.  You may urinate more often because the fetus is moving lower into your pelvis and pressing on your bladder.  You may develop or continue to have heartburn as a result of your pregnancy.  You may develop constipation because certain hormones are causing the muscles that push waste through your intestines to slow down.  You may develop hemorrhoids or swollen, bulging veins (varicose veins).  You may have pelvic pain because of the weight gain and pregnancy hormones relaxing your joints between the bones in your pelvis. Backaches may result from overexertion of the muscles supporting your posture.  You may have changes in your hair. These can include thickening of your hair, rapid growth, and changes in texture. Some women also have hair loss during or after pregnancy, or hair that feels dry or thin. Your hair will most likely return to normal after your baby is born.  Your breasts will continue to grow and be tender. A yellow discharge may leak from your breasts called colostrum.  Your belly button may stick out.  You may feel short of breath because of your expanding uterus.  You may notice the fetus "dropping," or moving lower in your abdomen.  You may have a bloody mucus discharge. This usually occurs a few days to a week before labor begins.  Your cervix becomes thin and soft (effaced) near your due date. WHAT TO EXPECT AT YOUR  PRENATAL EXAMS  You will have prenatal exams every 2 weeks until week 36. Then, you will have weekly prenatal exams. During a routine prenatal visit:  You will be weighed to make sure you and the fetus are growing normally.  Your blood pressure is taken.  Your abdomen will be measured to track your baby's growth.  The fetal heartbeat will be listened to.  Any test results from the previous visit will be discussed.  You may have a cervical check near your due date to see if you have effaced. At around 36 weeks, your caregiver will check your cervix. At the same time, your caregiver will also perform a test on the secretions of the vaginal tissue. This test is to determine if a type of bacteria, Group B streptococcus, is present. Your caregiver will explain this further. Your caregiver may ask you:  What your birth plan is.  How you are feeling.  If you are feeling the baby move.  If you have had any abnormal symptoms, such as leaking fluid, bleeding, severe headaches, or abdominal cramping.  If you are using any tobacco products, including cigarettes, chewing tobacco, and electronic cigarettes.  If you have any questions. Other tests or screenings that may be performed during your third trimester include:  Blood tests that check for low iron levels (anemia).  Fetal testing to check the health, activity level, and growth of the fetus. Testing is done if you have certain medical conditions or if   there are problems during the pregnancy.  HIV (human immunodeficiency virus) testing. If you are at high risk, you may be screened for HIV during your third trimester of pregnancy. FALSE LABOR You may feel small, irregular contractions that eventually go away. These are called Braxton Hicks contractions, or false labor. Contractions may last for hours, days, or even weeks before true labor sets in. If contractions come at regular intervals, intensify, or become painful, it is best to be seen  by your caregiver.  SIGNS OF LABOR   Menstrual-like cramps.  Contractions that are 5 minutes apart or less.  Contractions that start on the top of the uterus and spread down to the lower abdomen and back.  A sense of increased pelvic pressure or back pain.  A watery or bloody mucus discharge that comes from the vagina. If you have any of these signs before the 37th week of pregnancy, call your caregiver right away. You need to go to the hospital to get checked immediately. HOME CARE INSTRUCTIONS   Avoid all smoking, herbs, alcohol, and unprescribed drugs. These chemicals affect the formation and growth of the baby.  Do not use any tobacco products, including cigarettes, chewing tobacco, and electronic cigarettes. If you need help quitting, ask your health care provider. You may receive counseling support and other resources to help you quit.  Follow your caregiver's instructions regarding medicine use. There are medicines that are either safe or unsafe to take during pregnancy.  Exercise only as directed by your caregiver. Experiencing uterine cramps is a good sign to stop exercising.  Continue to eat regular, healthy meals.  Wear a good support bra for breast tenderness.  Do not use hot tubs, steam rooms, or saunas.  Wear your seat belt at all times when driving.  Avoid raw meat, uncooked cheese, cat litter boxes, and soil used by cats. These carry germs that can cause birth defects in the baby.  Take your prenatal vitamins.  Take 1500-2000 mg of calcium daily starting at the 20th week of pregnancy until you deliver your baby.  Try taking a stool softener (if your caregiver approves) if you develop constipation. Eat more high-fiber foods, such as fresh vegetables or fruit and whole grains. Drink plenty of fluids to keep your urine clear or pale yellow.  Take warm sitz baths to soothe any pain or discomfort caused by hemorrhoids. Use hemorrhoid cream if your caregiver  approves.  If you develop varicose veins, wear support hose. Elevate your feet for 15 minutes, 3-4 times a day. Limit salt in your diet.  Avoid heavy lifting, wear low heal shoes, and practice good posture.  Rest a lot with your legs elevated if you have leg cramps or low back pain.  Visit your dentist if you have not gone during your pregnancy. Use a soft toothbrush to brush your teeth and be gentle when you floss.  A sexual relationship may be continued unless your caregiver directs you otherwise.  Do not travel far distances unless it is absolutely necessary and only with the approval of your caregiver.  Take prenatal classes to understand, practice, and ask questions about the labor and delivery.  Make a trial run to the hospital.  Pack your hospital bag.  Prepare the baby's nursery.  Continue to go to all your prenatal visits as directed by your caregiver. SEEK MEDICAL CARE IF:  You are unsure if you are in labor or if your water has broken.  You have dizziness.  You have   mild pelvic cramps, pelvic pressure, or nagging pain in your abdominal area.  You have persistent nausea, vomiting, or diarrhea.  You have a bad smelling vaginal discharge.  You have pain with urination. SEEK IMMEDIATE MEDICAL CARE IF:   You have a fever.  You are leaking fluid from your vagina.  You have spotting or bleeding from your vagina.  You have severe abdominal cramping or pain.  You have rapid weight loss or gain.  You have shortness of breath with chest pain.  You notice sudden or extreme swelling of your face, hands, ankles, feet, or legs.  You have not felt your baby move in over an hour.  You have severe headaches that do not go away with medicine.  You have vision changes.   This information is not intended to replace advice given to you by your health care provider. Make sure you discuss any questions you have with your health care provider.   Document Released:  02/06/2001 Document Revised: 03/05/2014 Document Reviewed: 04/15/2012 Elsevier Interactive Patient Education 2016 [employer]  Breastfeeding Deciding to breastfeed is one of the best choices you can make for you and your baby. A change in hormones during pregnancy causes your breast tissue to grow and increases the number and size of your milk ducts. These hormones also allow proteins, sugars, and fats from your blood supply to make breast milk in your milk-producing glands. Hormones prevent breast milk from being released before your baby is born as well as prompt milk flow after birth. Once breastfeeding has begun, thoughts of your baby, as well as his or her sucking or crying, can stimulate the release of milk from your milk-producing glands.  BENEFITS OF BREASTFEEDING For Your Baby  Your first milk (colostrum) helps your baby's digestive system function better.  There are antibodies in your milk that help your baby fight off infections.  Your baby has a lower incidence of asthma, allergies, and sudden infant death syndrome.  The nutrients in breast milk are better for your baby than infant formulas and are designed uniquely for your baby's needs.  Breast milk improves your baby's brain development.  Your baby is less likely to develop other conditions, such as childhood obesity, asthma, or type 2 diabetes mellitus. For You  Breastfeeding helps to create a very special bond between you and your baby.  Breastfeeding is convenient. Breast milk is always available at the correct temperature and costs nothing.  Breastfeeding helps to burn calories and helps you lose the weight gained during pregnancy.  Breastfeeding makes your uterus contract to its prepregnancy size faster and slows bleeding (lochia) after you give birth.   Breastfeeding helps to lower your risk of developing type 2 diabetes mellitus, osteoporosis, and breast or ovarian cancer later in life. SIGNS THAT YOUR BABY IS  HUNGRY Early Signs of Hunger  Increased alertness or activity.  Stretching.  Movement of the head from side to side.  Movement of the head and opening of the mouth when the corner of the mouth or cheek is stroked (rooting).  Increased sucking sounds, smacking lips, cooing, sighing, or squeaking.  Hand-to-mouth movements.  Increased sucking of fingers or hands. Late Signs of Hunger  Fussing.  Intermittent crying. Extreme Signs of Hunger Signs of extreme hunger will require calming and consoling before your baby will be able to breastfeed successfully. Do not wait for the following signs of extreme hunger to occur before you initiate breastfeeding:  Restlessness.  A loud, strong cry.  Screaming.   BREASTFEEDING BASICS Breastfeeding Initiation  Find a comfortable place to sit or lie down, with your neck and back well supported.  Place a pillow or rolled up blanket under your baby to bring him or her to the level of your breast (if you are seated). Nursing pillows are specially designed to help support your arms and your baby while you breastfeed.  Make sure that your baby's abdomen is facing your abdomen.  Gently massage your breast. With your fingertips, massage from your chest wall toward your nipple in a circular motion. This encourages milk flow. You may need to continue this action during the feeding if your milk flows slowly.  Support your breast with 4 fingers underneath and your thumb above your nipple. Make sure your fingers are well away from your nipple and your baby's mouth.  Stroke your baby's lips gently with your finger or nipple.  When your baby's mouth is open wide enough, quickly bring your baby to your breast, placing your entire nipple and as much of the colored area around your nipple (areola) as possible into your baby's mouth.  More areola should be visible above your baby's upper lip than below the lower lip.  Your baby's tongue should be between his  or her lower gum and your breast.  Ensure that your baby's mouth is correctly positioned around your nipple (latched). Your baby's lips should create a seal on your breast and be turned out (everted).  It is common for your baby to suck about 2-3 minutes in order to start the flow of breast milk. Latching Teaching your baby how to latch on to your breast properly is very important. An improper latch can cause nipple pain and decreased milk supply for you and poor weight gain in your baby. Also, if your baby is not latched onto your nipple properly, he or she may swallow some air during feeding. This can make your baby fussy. Burping your baby when you switch breasts during the feeding can help to get rid of the air. However, teaching your baby to latch on properly is still the best way to prevent fussiness from swallowing air while breastfeeding. Signs that your baby has successfully latched on to your nipple:  Silent tugging or silent sucking, without causing you pain.  Swallowing heard between every 3-4 sucks.  Muscle movement above and in front of his or her ears while sucking. Signs that your baby has not successfully latched on to nipple:  Sucking sounds or smacking sounds from your baby while breastfeeding.  Nipple pain. If you think your baby has not latched on correctly, slip your finger into the corner of your baby's mouth to break the suction and place it between your baby's gums. Attempt breastfeeding initiation again. Signs of Successful Breastfeeding Signs from your baby:  A gradual decrease in the number of sucks or complete cessation of sucking.  Falling asleep.  Relaxation of his or her body.  Retention of a small amount of milk in his or her mouth.  Letting go of your breast by himself or herself. Signs from you:  Breasts that have increased in firmness, weight, and size 1-3 hours after feeding.  Breasts that are softer immediately after  breastfeeding.  Increased milk volume, as well as a change in milk consistency and color by the fifth day of breastfeeding.  Nipples that are not sore, cracked, or bleeding. Signs That Your Baby is Getting Enough Milk  Wetting at least 3 diapers in a 24-hour period.   The urine should be clear and pale yellow by age 5 days.  At least 3 stools in a 24-hour period by age 5 days. The stool should be soft and yellow.  At least 3 stools in a 24-hour period by age 7 days. The stool should be seedy and yellow.  No loss of weight greater than 10% of birth weight during the first 3 days of age.  Average weight gain of 4-7 ounces (113-198 g) per week after age 4 days.  Consistent daily weight gain by age 5 days, without weight loss after the age of 2 weeks. After a feeding, your baby may spit up a small amount. This is common. BREASTFEEDING FREQUENCY AND DURATION Frequent feeding will help you make more milk and can prevent sore nipples and breast engorgement. Breastfeed when you feel the need to reduce the fullness of your breasts or when your baby shows signs of hunger. This is called "breastfeeding on demand." Avoid introducing a pacifier to your baby while you are working to establish breastfeeding (the first 4-6 weeks after your baby is born). After this time you may choose to use a pacifier. Research has shown that pacifier use during the first year of a baby's life decreases the risk of sudden infant death syndrome (SIDS). Allow your baby to feed on each breast as long as he or she wants. Breastfeed until your baby is finished feeding. When your baby unlatches or falls asleep while feeding from the first breast, offer the second breast. Because newborns are often sleepy in the first few weeks of life, you may need to awaken your baby to get him or her to feed. Breastfeeding times will vary from baby to baby. However, the following rules can serve as a guide to help you ensure that your baby is  properly fed:  Newborns (babies 4 weeks of age or younger) may breastfeed every 1-3 hours.  Newborns should not go longer than 3 hours during the day or 5 hours during the night without breastfeeding.  You should breastfeed your baby a minimum of 8 times in a 24-hour period until you begin to introduce solid foods to your baby at around 6 months of age. BREAST MILK PUMPING Pumping and storing breast milk allows you to ensure that your baby is exclusively fed your breast milk, even at times when you are unable to breastfeed. This is especially important if you are going back to work while you are still breastfeeding or when you are not able to be present during feedings. Your lactation consultant can give you guidelines on how long it is safe to store breast milk. A breast pump is a machine that allows you to pump milk from your breast into a sterile bottle. The pumped breast milk can then be stored in a refrigerator or freezer. Some breast pumps are operated by hand, while others use electricity. Ask your lactation consultant which type will work best for you. Breast pumps can be purchased, but some hospitals and breastfeeding support groups lease breast pumps on a monthly basis. A lactation consultant can teach you how to hand express breast milk, if you prefer not to use a pump. CARING FOR YOUR BREASTS WHILE YOU BREASTFEED Nipples can become dry, cracked, and sore while breastfeeding. The following recommendations can help keep your breasts moisturized and healthy:  Avoid using soap on your nipples.  Wear a supportive bra. Although not required, special nursing bras and tank tops are designed to allow access to your   breasts for breastfeeding without taking off your entire bra or top. Avoid wearing underwire-style bras or extremely tight bras.  Air dry your nipples for 3-4minutes after each feeding.  Use only cotton bra pads to absorb leaked breast milk. Leaking of breast milk between feedings  is normal.  Use lanolin on your nipples after breastfeeding. Lanolin helps to maintain your skin's normal moisture barrier. If you use pure lanolin, you do not need to wash it off before feeding your baby again. Pure lanolin is not toxic to your baby. You may also hand express a few drops of breast milk and gently massage that milk into your nipples and allow the milk to air dry. In the first few weeks after giving birth, some women experience extremely full breasts (engorgement). Engorgement can make your breasts feel heavy, warm, and tender to the touch. Engorgement peaks within 3-5 days after you give birth. The following recommendations can help ease engorgement:  Completely empty your breasts while breastfeeding or pumping. You may want to start by applying warm, moist heat (in the shower or with warm water-soaked hand towels) just before feeding or pumping. This increases circulation and helps the milk flow. If your baby does not completely empty your breasts while breastfeeding, pump any extra milk after he or she is finished.  Wear a snug bra (nursing or regular) or tank top for 1-2 days to signal your body to slightly decrease milk production.  Apply ice packs to your breasts, unless this is too uncomfortable for you.  Make sure that your baby is latched on and positioned properly while breastfeeding. If engorgement persists after 48 hours of following these recommendations, contact your health care provider or a lactation consultant. OVERALL HEALTH CARE RECOMMENDATIONS WHILE BREASTFEEDING  Eat healthy foods. Alternate between meals and snacks, eating 3 of each per day. Because what you eat affects your breast milk, some of the foods may make your baby more irritable than usual. Avoid eating these foods if you are sure that they are negatively affecting your baby.  Drink milk, fruit juice, and water to satisfy your thirst (about 10 glasses a day).  Rest often, relax, and continue to take  your prenatal vitamins to prevent fatigue, stress, and anemia.  Continue breast self-awareness checks.  Avoid chewing and smoking tobacco. Chemicals from cigarettes that pass into breast milk and exposure to secondhand smoke may harm your baby.  Avoid alcohol and drug use, including marijuana. Some medicines that may be harmful to your baby can pass through breast milk. It is important to ask your health care provider before taking any medicine, including all over-the-counter and prescription medicine as well as vitamin and herbal supplements. It is possible to become pregnant while breastfeeding. If birth control is desired, ask your health care provider about options that will be safe for your baby. SEEK MEDICAL CARE IF:  You feel like you want to stop breastfeeding or have become frustrated with breastfeeding.  You have painful breasts or nipples.  Your nipples are cracked or bleeding.  Your breasts are red, tender, or warm.  You have a swollen area on either breast.  You have a fever or chills.  You have nausea or vomiting.  You have drainage other than breast milk from your nipples.  Your breasts do not become full before feedings by the fifth day after you give birth.  You feel sad and depressed.  Your baby is too sleepy to eat well.  Your baby is having trouble sleeping.     Your baby is wetting less than 3 diapers in a 24-hour period.  Your baby has less than 3 stools in a 24-hour period.  Your baby's skin or the white part of his or her eyes becomes yellow.   Your baby is not gaining weight by 33 days of age. SEEK IMMEDIATE MEDICAL CARE IF:  Your baby is overly tired (lethargic) and does not want to wake up and feed.  Your baby develops an unexplained fever.   This information is not intended to replace advice given to you by your health care provider. Make sure you discuss any questions you have with your health care provider.   Document Released: 02/12/2005  Document Revised: 11/03/2014 Document Reviewed: 08/06/2012 Elsevier Interactive Patient Education 2016 [employer]. Levonorgestrel intrauterine device (IUD) What is this medicine? LEVONORGESTREL IUD (LEE voe nor jes trel) is a contraceptive (birth control) device. The device is placed inside the uterus by a healthcare professional. It is used to prevent pregnancy and can also be used to treat heavy bleeding that occurs during your period. Depending on the device, it can be used for 3 to 5 years. This medicine may be used for other purposes; ask your health care provider or pharmacist if you have questions. What should I tell my health care provider before I take this medicine? They need to know if you have any of these conditions: -abnormal Pap smear -cancer of the breast, uterus, or cervix -diabetes -endometritis -genital or pelvic infection now or in the past -have more than one sexual partner or your partner has more than one partner -heart disease -history of an ectopic or tubal pregnancy -immune system problems -IUD in place -liver disease or tumor -problems with blood clots or take blood-thinners -use intravenous drugs -uterus of unusual shape -vaginal bleeding that has not been explained -an unusual or allergic reaction to levonorgestrel, other hormones, silicone, or polyethylene, medicines, foods, dyes, or preservatives -pregnant or trying to get pregnant -breast-feeding How should I use this medicine? This device is placed inside the uterus by a health care professional. Talk to your pediatrician regarding the use of this medicine in children. Special care may be needed. Overdosage: If you think you have taken too much of this medicine contact a poison control center or emergency room at once. NOTE: This medicine is only for you. Do not share this medicine with others. What if I miss a dose? This does not apply. What may interact with this medicine? Do not take this  medicine with any of the following medications: -amprenavir -bosentan -fosamprenavir This medicine may also interact with the following medications: -aprepitant -barbiturate medicines for inducing sleep or treating seizures -bexarotene -griseofulvin -medicines to treat seizures like carbamazepine, ethotoin, felbamate, oxcarbazepine, phenytoin, topiramate -modafinil -pioglitazone -rifabutin -rifampin -rifapentine -some medicines to treat HIV infection like atazanavir, indinavir, lopinavir, nelfinavir, tipranavir, ritonavir -St. John's wort -warfarin This list may not describe all possible interactions. Give your health care provider a list of all the medicines, herbs, non-prescription drugs, or dietary supplements you use. Also tell them if you smoke, drink alcohol, or use illegal drugs. Some items may interact with your medicine. What should I watch for while using this medicine? Visit your doctor or health care professional for regular check ups. See your doctor if you or your partner has sexual contact with others, becomes HIV positive, or gets a sexual transmitted disease. This product does not protect you against HIV infection (AIDS) or other sexually transmitted diseases. You can check the placement  of the IUD yourself by reaching up to the top of your vagina with clean fingers to feel the threads. Do not pull on the threads. It is a good habit to check placement after each menstrual period. Call your doctor right away if you feel more of the IUD than just the threads or if you cannot feel the threads at all. The IUD may come out by itself. You may become pregnant if the device comes out. If you notice that the IUD has come out use a backup birth control method like condoms and call your health care provider. Using tampons will not change the position of the IUD and are okay to use during your period. What side effects may I notice from receiving this medicine? Side effects that you  should report to your doctor or health care professional as soon as possible: -allergic reactions like skin rash, itching or hives, swelling of the face, lips, or tongue -fever, flu-like symptoms -genital sores -high blood pressure -no menstrual period for 6 weeks during use -pain, swelling, warmth in the leg -pelvic pain or tenderness -severe or sudden headache -signs of pregnancy -stomach cramping -sudden shortness of breath -trouble with balance, talking, or walking -unusual vaginal bleeding, discharge -yellowing of the eyes or skin Side effects that usually do not require medical attention (report to your doctor or health care professional if they continue or are bothersome): -acne -breast pain -change in sex drive or performance -changes in weight -cramping, dizziness, or faintness while the device is being inserted -headache -irregular menstrual bleeding within first 3 to 6 months of use -nausea This list may not describe all possible side effects. Call your doctor for medical advice about side effects. You may report side effects to FDA at 1-800-FDA-1088. Where should I keep my medicine? This does not apply. NOTE: This sheet is a summary. It may not cover all possible information. If you have questions about this medicine, talk to your doctor, pharmacist, or health care provider.    2016, Elsevier/Gold Standard. (2011-03-15 13:54:04)

## 2015-01-15 LAB — RPR

## 2015-01-15 LAB — HIV ANTIBODY (ROUTINE TESTING W REFLEX): HIV 1&2 Ab, 4th Generation: NONREACTIVE

## 2015-01-15 LAB — GLUCOSE TOLERANCE, 1 HOUR (50G) W/O FASTING: Glucose, 1 Hour GTT: 139 mg/dL (ref 70–140)

## 2015-01-25 ENCOUNTER — Other Ambulatory Visit (INDEPENDENT_AMBULATORY_CARE_PROVIDER_SITE_OTHER): Payer: BLUE CROSS/BLUE SHIELD | Admitting: *Deleted

## 2015-01-25 DIAGNOSIS — R7302 Impaired glucose tolerance (oral): Secondary | ICD-10-CM | POA: Diagnosis not present

## 2015-01-25 DIAGNOSIS — R7309 Other abnormal glucose: Secondary | ICD-10-CM

## 2015-01-25 NOTE — Progress Notes (Signed)
Patient is here today for a 3 hr GTT.

## 2015-01-26 ENCOUNTER — Encounter: Payer: Self-pay | Admitting: Family Medicine

## 2015-01-26 DIAGNOSIS — Z8632 Personal history of gestational diabetes: Secondary | ICD-10-CM | POA: Insufficient documentation

## 2015-01-26 LAB — GLUCOSE TOLERANCE, 3 HOURS
GLUCOSE 3 HOUR GTT: 85 mg/dL (ref 70–144)
GLUCOSE, 1 HOUR-GESTATIONAL: 200 mg/dL — AB (ref 70–189)
GLUCOSE, 2 HOUR-GESTATIONAL: 166 mg/dL — AB (ref 70–164)
Glucose Tolerance, Fasting: 92 mg/dL (ref 65–99)

## 2015-01-27 ENCOUNTER — Encounter: Payer: Self-pay | Admitting: *Deleted

## 2015-01-27 ENCOUNTER — Telehealth: Payer: Self-pay | Admitting: *Deleted

## 2015-01-27 NOTE — Telephone Encounter (Signed)
-----   Message from Reva Boresanya S Pratt, MD sent at 01/26/2015  4:43 PM EST ----- Has GDM--sent her message on myChart--needs Nutrition and diabetes management appointment.

## 2015-01-27 NOTE — Telephone Encounter (Signed)
Informed pt of 3 Hr GTT result and that we would be referring her to a diabetes educator.  [facility] [facility] [place] [facility] [place], notified pt that someone would be contacting her for an appt. Pt acknowledged.

## 2015-02-01 ENCOUNTER — Encounter: Payer: BLUE CROSS/BLUE SHIELD | Admitting: Obstetrics & Gynecology

## 2015-02-02 ENCOUNTER — Encounter: Payer: BLUE CROSS/BLUE SHIELD | Attending: Family Medicine

## 2015-02-02 ENCOUNTER — Telehealth: Payer: Self-pay | Admitting: *Deleted

## 2015-02-02 VITALS — Ht 64.0 in | Wt 181.3 lb

## 2015-02-02 DIAGNOSIS — O24419 Gestational diabetes mellitus in pregnancy, unspecified control: Secondary | ICD-10-CM | POA: Diagnosis present

## 2015-02-02 MED ORDER — GLUCOSE BLOOD VI STRP: ORAL_STRIP | Status: DC

## 2015-02-02 MED ORDER — EZ SMART BLOOD GLUCOSE LANCETS MISC: Status: DC

## 2015-02-02 NOTE — Progress Notes (Signed)
  Patient was seen on 02/02/15 for Gestational Diabetes self-management . The following learning objectives were met by the patient :   States the definition of Gestational Diabetes  States why dietary management is important in controlling blood glucose  Describes the effects of carbohydrates on blood glucose levels  Demonstrates ability to create a balanced meal plan  Demonstrates carbohydrate counting   States when to check blood glucose levels  Demonstrates proper blood glucose monitoring techniques  States the effect of stress and exercise on blood glucose levels  States the importance of limiting caffeine and abstaining from alcohol and smoking  Plan:  Aim for 2 Carb Choices per meal (30 grams) +/- 1 either way for breakfast Aim for 3 Carb Choices per meal (45 grams) +/- 1 either way from lunch and dinner Aim for 1-2 Carbs per snack Begin reading food labels for Total Carbohydrate and sugar grams of foods Consider  increasing your activity level by walking daily as tolerated Begin checking BG before breakfast and 1-2 hours after first bit of breakfast, lunch and dinner after  as directed by MD  Take medication  as directed by MD  Blood glucose monitor given: One Touch Verio Lot # [id number] X Exp: 07/2015 Blood glucose reading: 58m/dl  Patient instructed to monitor glucose levels: FBS: 60 - <90 1 hour: <140 2 hour: <120  Patient received the following handouts:  Nutrition Diabetes and Pregnancy  Carbohydrate Counting List  Meal Planning worksheet  Patient will be seen for follow-up as needed.

## 2015-02-02 NOTE — Telephone Encounter (Signed)
I have sent in test strips and lancets for patient.

## 2015-02-02 NOTE — Telephone Encounter (Signed)
-----   Message from Olevia BowensJacinda S Battle sent at 02/02/2015 11:43 AM EST ----- Regarding: Rx Request Contact: [phone] Needs a Rx for lancet and test strips, prescribed a glucose meter (One Touch Verio) Uses [facility] on [address] [place]

## 2015-02-08 ENCOUNTER — Ambulatory Visit (INDEPENDENT_AMBULATORY_CARE_PROVIDER_SITE_OTHER): Payer: BLUE CROSS/BLUE SHIELD | Admitting: Obstetrics & Gynecology

## 2015-02-08 VITALS — BP 109/71 | HR 87 | Wt 178.0 lb

## 2015-02-08 DIAGNOSIS — O0993 Supervision of high risk pregnancy, unspecified, third trimester: Secondary | ICD-10-CM

## 2015-02-08 DIAGNOSIS — O24410 Gestational diabetes mellitus in pregnancy, diet controlled: Secondary | ICD-10-CM

## 2015-02-08 DIAGNOSIS — O0990 Supervision of high risk pregnancy, unspecified, unspecified trimester: Secondary | ICD-10-CM | POA: Insufficient documentation

## 2015-02-08 DIAGNOSIS — O09523 Supervision of elderly multigravida, third trimester: Secondary | ICD-10-CM

## 2015-02-08 NOTE — Patient Instructions (Signed)
Return to clinic for any obstetric concerns or go to [facility] for evaluation Gestational Diabetes Mellitus Gestational diabetes mellitus, often simply referred to as gestational diabetes, is a type of diabetes that some women develop during pregnancy. In gestational diabetes, the pancreas does not make enough insulin (a hormone), the cells are less responsive to the insulin that is made (insulin resistance), or both.Normally, insulin moves sugars from food into the tissue cells. The tissue cells use the sugars for energy. The lack of insulin or the lack of normal response to insulin causes excess sugars to build up in the blood instead of going into the tissue cells. As a result, high blood sugar (hyperglycemia) develops. The effect of high sugar (glucose) levels can cause many problems.  RISK FACTORS You have an increased chance of developing gestational diabetes if you have a family history of diabetes and also have one or more of the following risk factors:  A body mass index over 30 (obesity).  A previous pregnancy with gestational diabetes.  An older age at the time of pregnancy. If blood glucose levels are kept in the normal range during pregnancy, women can have a healthy pregnancy. If your blood glucose levels are not well controlled, there may be risks to you, your unborn baby (fetus), your labor and delivery, or your newborn baby.  SYMPTOMS  If symptoms are experienced, they are much like symptoms you would normally expect during pregnancy. The symptoms of gestational diabetes include:   Increased thirst (polydipsia).  Increased urination (polyuria).  Increased urination during the night (nocturia).  Weight loss. This weight loss may be rapid.  Frequent, recurring infections.  Tiredness (fatigue).  Weakness.  Vision changes, such as blurred vision.  Fruity smell to your breath.  Abdominal pain. DIAGNOSIS Diabetes is diagnosed when blood glucose levels are increased. Your  blood glucose level may be checked by one or more of the following blood tests:  A fasting blood glucose test. You will not be allowed to eat for at least 8 hours before a blood sample is taken.  A random blood glucose test. Your blood glucose is checked at any time of the day regardless of when you ate.  An oral glucose tolerance test (OGTT). Your blood glucose is measured after you have not eaten (fasted) for 1-3 hours and then after you drink a glucose-containing beverage. Since the hormones that cause insulin resistance are highest at about 24-28 weeks of a pregnancy, an OGTT is usually performed during that time. If you have risk factors, you may be screened for undiagnosed type 2 diabetes at your first prenatal visit. TREATMENT  Gestational diabetes should be managed first with diet and exercise. Medicines may be added only if they are needed.  You will need to take diabetes medicine or insulin daily to keep blood glucose levels in the desired range.  You will need to match insulin dosing with exercise and healthy food choices. If you have gestational diabetes, your treatment goal is to maintain the following blood glucose levels:  Before meals (preprandial): at or below 95 mg/dL.  After meals (postprandial):  One hour after a meal: at or below 140 mg/dL.  Two hours after a meal: at or below 120 mg/dL. If you have pre-existing type 1 or type 2 diabetes, your treatment goal is to maintain the following blood glucose levels:  Before meals, at bedtime, and overnight: 60-99 mg/dL.  After meals: peak of 100-129 mg/dL. HOME CARE INSTRUCTIONS   Have your hemoglobin A1c level  checked twice a year.  Perform daily blood glucose monitoring as directed by your health care provider. It is common to perform frequent blood glucose monitoring.  Monitor urine ketones when you are ill and as directed by your health care provider.  Take your diabetes medicine and insulin as directed by your  health care provider to maintain your blood glucose level in the desired range.  Never run out of diabetes medicine or insulin. It is needed every day.  Adjust insulin based on your intake of carbohydrates. Carbohydrates can raise blood glucose levels but need to be included in your diet. Carbohydrates provide vitamins, minerals, and fiber which are an essential part of a healthy diet. Carbohydrates are found in fruits, vegetables, whole grains, dairy products, legumes, and foods containing added sugars.  Eat healthy foods. Alternate 3 meals with 3 snacks.  Maintain a healthy weight gain. The usual total expected weight gain varies according to your prepregnancy body mass index (BMI).  Carry a medical alert card or wear your medical alert jewelry.  Carry a 15-gram carbohydrate snack with you at all times to treat low blood glucose (hypoglycemia). Some examples of 15-gram carbohydrate snacks include:  Glucose tablets, 3 or 4.  Glucose gel, 15-gram tube.  Raisins, 2 tablespoons (24 g).  Jelly beans, 6.  Animal crackers, 8.  Fruit juice, regular soda, or low-fat milk, 4 ounces (120 mL).  Gummy treats, 9.  Recognize hypoglycemia. Hypoglycemia during pregnancy occurs with blood glucose levels of 60 mg/dL and below. The risk for hypoglycemia increases when fasting or skipping meals, during or after intense exercise, and during sleep. Hypoglycemia symptoms can include:  Tremors or shakes.  Decreased ability to concentrate.  Sweating.  Increased heart rate.  Headache.  Dry mouth.  Hunger.  Irritability.  Anxiety.  Restless sleep.  Altered speech or coordination.  Confusion.  Treat hypoglycemia promptly. If you are alert and able to safely swallow, follow the 15:15 rule:  Take 15-20 grams of rapid-acting glucose or carbohydrate. Rapid-acting options include glucose gel, glucose tablets, or 4 ounces (120 mL) of fruit juice, regular soda, or low-fat milk.  Check your  blood glucose level 15 minutes after taking the glucose.  Take 15-20 grams more of glucose if the repeat blood glucose level is still 70 mg/dL or below.  Eat a meal or snack within 1 hour once blood glucose levels return to normal.  Be alert to polyuria (excess urination) and polydipsia (excess thirst) which are early signs of hyperglycemia. An early awareness of hyperglycemia allows for prompt treatment. Treat hyperglycemia as directed by your health care provider.  Engage in at least 30 minutes of physical activity a day or as directed by your health care provider. Ten minutes of physical activity timed 30 minutes after each meal is encouraged to control postprandial blood glucose levels.  Adjust your insulin dosing and food intake as needed if you start a new exercise or sport.  Follow your sick-day plan at any time you are unable to eat or drink as usual.  Avoid tobacco and alcohol use.  Keep all follow-up visits as directed by your health care provider.  Follow the advice of your health care provider regarding your prenatal and post-delivery (postpartum) appointments, meal planning, exercise, medicines, vitamins, blood tests, other medical tests, and physical activities.  Perform daily skin and foot care. Examine your skin and feet daily for cuts, bruises, redness, nail problems, bleeding, blisters, or sores.  Brush your teeth and gums at least twice a  day and floss at least once a day. Follow up with your dentist regularly.  Schedule an eye exam during the first trimester of your pregnancy or as directed by your health care provider.  Share your diabetes management plan with your workplace or school.  Stay up-to-date with immunizations.  Learn to manage stress.  Obtain ongoing diabetes education and support as needed.  Learn about and consider breastfeeding your baby.  You should have your blood sugar level checked 6-12 weeks after delivery. This is done with an oral glucose  tolerance test (OGTT). SEEK MEDICAL CARE IF:   You are unable to eat food or drink fluids for more than 6 hours.  You have nausea and vomiting for more than 6 hours.  You have a blood glucose level of 200 mg/dL and you have ketones in your urine.  There is a change in mental status.  You develop vision problems.  You have a persistent headache.  You have upper abdominal pain or discomfort.  You develop an additional serious illness.  You have diarrhea for more than 6 hours.  You have been sick or have had a fever for a couple of days and are not getting better. SEEK IMMEDIATE MEDICAL CARE IF:   You have difficulty breathing.  You no longer feel the baby moving.  You are bleeding or have discharge from your vagina.  You start having premature contractions or labor. MAKE SURE YOU:  Understand these instructions.  Will watch your condition.  Will get help right away if you are not doing well or get worse.   This information is not intended to replace advice given to you by your health care provider. Make sure you discuss any questions you have with your health care provider.   Document Released: 05/21/2000 Document Revised: 03/05/2014 Document Reviewed: 09/11/2011 Elsevier Interactive Patient Education [employer]2016 Elsevier Inc.

## 2015-02-08 NOTE — Progress Notes (Signed)
Subjective:  [name] [name] is a 39 y.o. G5P4004 at 3879w3d being seen today for ongoing prenatal care.  She is currently monitored for the following issues for this high-risk pregnancy and has AMA (advanced maternal age) multigravida 35+; Gestational diabetes mellitus (GDM), antepartum; and Supervision of high-risk pregnancy on her problem list.  Patient reports no complaints.  Contractions: Not present. Vag. Bleeding: None.  Movement: Present. Denies leaking of fluid.   The following portions of the patient's history were reviewed and updated as appropriate: allergies, current medications, past family history, past medical history, past social history, past surgical history and problem list. Problem list updated.  Objective:   Filed Vitals:   02/08/15 0851  BP: 109/71  Pulse: 87  Weight: 178 lb (80.74 kg)    Fetal Status: Fetal Heart Rate (bpm): 134 Fundal Height: 30 cm Movement: Present     General:  Alert, oriented and cooperative. Patient is in no acute distress.  Skin: Skin is warm and dry. No rash noted.   Cardiovascular: Normal heart rate noted  Respiratory: Normal respiratory effort, no problems with respiration noted  Abdomen: Soft, gravid, appropriate for gestational age. Pain/Pressure: Absent     Pelvic: Vag. Bleeding: None Vag D/C Character: Thin  Cervical exam deferred        Extremities: Normal range of motion.  Edema: None  Mental Status: Normal mood and affect. Normal behavior. Normal judgment and thought content.   Urinalysis: Urine Protein: Negative Urine Glucose: Negative  Assessment and Plan:  Pregnancy: G5P4004 at 7579w3d  1. Diet controlled gestational diabetes mellitus (GDM), antepartum Patient did not bring CBG log. Emphasised importance of bringing log/meter to every visit.  She reports fastings of 80-90s, postprandials in 90-100s on her new diet.  She is walking around a lot.  Doing well. Will get growth scan at 38 weeks.  2. AMA (advanced maternal age)  multigravida 35+, third trimester Resolved CPCs, patient declined any further testing.  3. Supervision of high-risk pregnancy, third trimester Preterm labor symptoms and general obstetric precautions including but not limited to vaginal bleeding, contractions, leaking of fluid and fetal movement were reviewed in detail with the patient. Please refer to After Visit Summary for other counseling recommendations.  No Follow-up on file.   Tereso NewcomerUgonna A Anyanwu, MD

## 2015-02-09 ENCOUNTER — Telehealth: Payer: Self-pay | Admitting: *Deleted

## 2015-02-09 NOTE — Telephone Encounter (Signed)
I called pharmacy and pharmacy said medicaid only covers Accuchek meters, strips and lancets.   I have called in all three.  Patient is aware.

## 2015-02-23 ENCOUNTER — Encounter: Payer: BLUE CROSS/BLUE SHIELD | Admitting: Obstetrics & Gynecology

## 2015-02-27 NOTE — L&D Delivery Note (Signed)
Delivery Note At 6:08 AM a viable female was delivered via Vaginal, Spontaneous Delivery (Presentation: Left Occiput Anterior).  APGAR: 8, 9; weight 6 lb 3.2 oz (2812 g).  After 3 minutes, the cord was clamped and cut. 40 units of pitocin diluted in 1000cc LR was infused rapidly IV.  The placenta separated spontaneously and delivered via CCT and maternal pushing effort.  It was inspected and appears to be intact with a 3 VC.    Anesthesia: Local  Episiotomy: None Lacerations: 1st degree;Perineal Suture Repair: 2.0 vicryl Est. Blood Loss (mL): 75  Mom to postpartum.  Baby to Couplet care / Skin to Skin.  [name],[name] Pickerill 03/25/2015, 7:47 AM

## 2015-03-03 ENCOUNTER — Encounter: Payer: Self-pay | Admitting: Obstetrics and Gynecology

## 2015-03-03 ENCOUNTER — Ambulatory Visit (INDEPENDENT_AMBULATORY_CARE_PROVIDER_SITE_OTHER): Payer: BLUE CROSS/BLUE SHIELD | Admitting: Obstetrics and Gynecology

## 2015-03-03 VITALS — BP 126/79 | HR 87 | Wt 178.0 lb

## 2015-03-03 DIAGNOSIS — O24410 Gestational diabetes mellitus in pregnancy, diet controlled: Secondary | ICD-10-CM

## 2015-03-03 DIAGNOSIS — O09523 Supervision of elderly multigravida, third trimester: Secondary | ICD-10-CM

## 2015-03-03 NOTE — Progress Notes (Signed)
Subjective:  [name] [name] is a 40 y.o. G5P4004 at 7719w5d being seen today for ongoing prenatal care.  She is currently monitored for the following issues for this high-risk pregnancy and has AMA (advanced maternal age) multigravida 35+; Gestational diabetes mellitus (GDM), antepartum; and Supervision of high-risk pregnancy on her problem list.  Patient reports no complaints.  Contractions: Not present. Vag. Bleeding: None.  Movement: Present. Denies leaking of fluid.   The following portions of the patient's history were reviewed and updated as appropriate: allergies, current medications, past family history, past medical history, past social history, past surgical history and problem list. Problem list updated.  Objective:   Filed Vitals:   03/03/15 0928  BP: 126/79  Pulse: 87  Weight: 178 lb (80.74 kg)    Fetal Status: Fetal Heart Rate (bpm): 143   Movement: Present     General:  Alert, oriented and cooperative. Patient is in no acute distress.  Skin: Skin is warm and dry. No rash noted.   Cardiovascular: Normal heart rate noted  Respiratory: Normal respiratory effort, no problems with respiration noted  Abdomen: Soft, gravid, appropriate for gestational age. Pain/Pressure: Absent     Pelvic: Vag. Bleeding: None Vag D/C Character: Thin   Cervical exam deferred        Extremities: Normal range of motion.  Edema: None  Mental Status: Normal mood and affect. Normal behavior. Normal judgment and thought content.   Urinalysis: Urine Protein: Negative Urine Glucose: Negative  Assessment and Plan:  Pregnancy: G5P4004 at 2819w5d  1. AMA (advanced maternal age) multigravida 35+, third trimester   2. Diet controlled gestational diabetes mellitus (GDM), antepartum CBGs reviewed. All pp within range. 2 fasting out of range 102 is highest Discussed consuming a protein rich snack at bedtime Growth ultrasound ordered at 38 weeks Cultures next visit. Patient declined to have them done  today - US MFM OB FOLLOW UP; Future  Preterm labor symptoms and general obstetric precautions including but not limited to vaginal bleeding, contractions, leaking of fluid and fetal movement were reviewed in detail with the patient. Please refer to After Visit Summary for other counseling recommendations.  Return in about 1 week (around 03/10/2015).   Catalina AntiguaPeggy Aedyn Mckeon, MD

## 2015-03-11 ENCOUNTER — Ambulatory Visit (INDEPENDENT_AMBULATORY_CARE_PROVIDER_SITE_OTHER): Payer: BLUE CROSS/BLUE SHIELD | Admitting: Family Medicine

## 2015-03-11 VITALS — BP 122/80 | HR 90 | Wt 181.0 lb

## 2015-03-11 DIAGNOSIS — O24410 Gestational diabetes mellitus in pregnancy, diet controlled: Secondary | ICD-10-CM

## 2015-03-11 DIAGNOSIS — Z113 Encounter for screening for infections with a predominantly sexual mode of transmission: Secondary | ICD-10-CM | POA: Diagnosis not present

## 2015-03-11 DIAGNOSIS — O09523 Supervision of elderly multigravida, third trimester: Secondary | ICD-10-CM

## 2015-03-11 DIAGNOSIS — O0993 Supervision of high risk pregnancy, unspecified, third trimester: Secondary | ICD-10-CM

## 2015-03-11 NOTE — Progress Notes (Signed)
Subjective:  [name] [name] is a 40 y.o. G5P4004 at 6572w6d being seen today for ongoing prenatal care.  She is currently monitored for the following issues for this high-risk pregnancy and has AMA (advanced maternal age) multigravida 35+; Gestational diabetes mellitus (GDM), antepartum; and Supervision of high-risk pregnancy on her problem list.  Patient reports no complaints.  Contractions: Not present. Vag. Bleeding: None.  Movement: Present. Denies leaking of fluid.   The following portions of the patient's history were reviewed and updated as appropriate: allergies, current medications, past family history, past medical history, past social history, past surgical history and problem list. Problem list updated.  Objective:   Filed Vitals:   03/11/15 0845  BP: 122/80  Pulse: 90  Weight: 181 lb (82.101 kg)    Fetal Status: Fetal Heart Rate (bpm): 138 Fundal Height: 35 cm Movement: Present  Presentation: Vertex  General:  Alert, oriented and cooperative. Patient is in no acute distress.  Skin: Skin is warm and dry. No rash noted.   Cardiovascular: Normal heart rate noted  Respiratory: Normal respiratory effort, no problems with respiration noted  Abdomen: Soft, gravid, appropriate for gestational age. Pain/Pressure: Absent     Pelvic: Vag. Bleeding: None Vag D/C Character: Thin   Cervical exam performed Dilation: Closed Effacement (%): 70 Station: -2  Extremities: Normal range of motion.  Edema: None  Mental Status: Normal mood and affect. Normal behavior. Normal judgment and thought content.   Urinalysis: Urine Protein: Negative Urine Glucose: Negative FBS 92-99 about 1/2 are less than 95 2 hour pp are all in range Assessment and Plan:  Pregnancy: G5P4004 at 2272w6d  1. Supervision of high-risk pregnancy, third trimester Continue  prenatal care. - Urine cytology ancillary only - Culture, beta strep (group b only)  2. Diet controlled gestational diabetes mellitus (GDM),  antepartum Continue diet--for u/s for growth next week  Term labor symptoms and general obstetric precautions including but not limited to vaginal bleeding, contractions, leaking of fluid and fetal movement were reviewed in detail with the patient. Please refer to After Visit Summary for other counseling recommendations.  Return in 1 week (on 03/18/2015).   [name] Boresanya S Lashonna Rieke, MD

## 2015-03-11 NOTE — Patient Instructions (Signed)
Postpartum Depression and Baby Blues The postpartum period begins right after the birth of a baby. During this time, there is often a great amount of joy and excitement. It is also a time of many changes in the life of the parents. Regardless of how many times a mother gives birth, each child brings new challenges and dynamics to the family. It is not unusual to have feelings of excitement along with confusing shifts in moods, emotions, and thoughts. All mothers are at risk of developing postpartum depression or the "baby blues." These mood changes can occur right after giving birth, or they may occur many months after giving birth. The baby blues or postpartum depression can be mild or severe. Additionally, postpartum depression can go away rather quickly, or it can be a long-term condition.  CAUSES Raised hormone levels and the rapid drop in those levels are thought to be a main cause of postpartum depression and the baby blues. A number of hormones change during and after pregnancy. Estrogen and progesterone usually decrease right after the delivery of your baby. The levels of thyroid hormone and various cortisol steroids also rapidly drop. Other factors that play a role in these mood changes include major life events and genetics.  RISK FACTORS If you have any of the following risks for the baby blues or postpartum depression, know what symptoms to watch out for during the postpartum period. Risk factors that may increase the likelihood of getting the baby blues or postpartum depression include:  Having a personal or family history of depression.   Having depression while being pregnant.   Having premenstrual mood issues or mood issues related to oral contraceptives.  Having a lot of life stress.   Having marital conflict.   Lacking a social support network.   Having a baby with special needs.   Having health problems, such as diabetes.  SIGNS AND SYMPTOMS Symptoms of baby blues  include:  Brief changes in mood, such as going from extreme happiness to sadness.  Decreased concentration.   Difficulty sleeping.   Crying spells, tearfulness.   Irritability.   Anxiety.  Symptoms of postpartum depression typically begin within the first month after giving birth. These symptoms include:  Difficulty sleeping or excessive sleepiness.   Marked weight loss.   Agitation.   Feelings of worthlessness.   Lack of interest in activity or food.  Postpartum psychosis is a very serious condition and can be dangerous. Fortunately, it is rare. Displaying any of the following symptoms is cause for immediate medical attention. Symptoms of postpartum psychosis include:   Hallucinations and delusions.   Bizarre or disorganized behavior.   Confusion or disorientation.  DIAGNOSIS  A diagnosis is made by an evaluation of your symptoms. There are no medical or lab tests that lead to a diagnosis, but there are various questionnaires that a health care provider may use to identify those with the baby blues, postpartum depression, or psychosis. Often, a screening tool called the Edinburgh Postnatal Depression Scale is used to diagnose depression in the postpartum period.  TREATMENT The baby blues usually goes away on its own in 1-2 weeks. Social support is often all that is needed. You will be encouraged to get adequate sleep and rest. Occasionally, you may be given medicines to help you sleep.  Postpartum depression requires treatment because it can last several months or longer if it is not treated. Treatment may include individual or group therapy, medicine, or both to address any social, physiological, and psychological   factors that may play a role in the depression. Regular exercise, a healthy diet, rest, and social support may also be strongly recommended.  Postpartum psychosis is more serious and needs treatment right away. Hospitalization is often needed. HOME CARE  INSTRUCTIONS  Get as much rest as you can. Nap when the baby sleeps.   Exercise regularly. Some women find yoga and walking to be beneficial.   Eat a balanced and nourishing diet.   Do little things that you enjoy. Have a cup of tea, take a bubble bath, read your favorite magazine, or listen to your favorite music.  Avoid alcohol.   Ask for help with household chores, cooking, grocery shopping, or running errands as needed. Do not try to do everything.   Talk to people close to you about how you are feeling. Get support from your partner, family members, friends, or other new moms.  Try to stay positive in how you think. Think about the things you are grateful for.   Do not spend a lot of time alone.   Only take over-the-counter or prescription medicine as directed by your health care provider.  Keep all your postpartum appointments.   Let your health care provider know if you have any concerns.  SEEK MEDICAL CARE IF: You are having a reaction to or problems with your medicine. SEEK IMMEDIATE MEDICAL CARE IF:  You have suicidal feelings.   You think you may harm the baby or someone else. MAKE SURE YOU:  Understand these instructions.  Will watch your condition.  Will get help right away if you are not doing well or get worse.   This information is not intended to replace advice given to you by your health care provider. Make sure you discuss any questions you have with your health care provider.   Document Released: 11/17/2003 Document Revised: 02/17/2013 Document Reviewed: 11/24/2012 Elsevier Interactive Patient Education [employer]  Breastfeeding Deciding to breastfeed is one of the best choices you can make for you and your baby. A change in hormones during pregnancy causes your breast tissue to grow and increases the number and size of your milk ducts. These hormones also allow proteins, sugars, and fats from your blood supply to make breast milk  in your milk-producing glands. Hormones prevent breast milk from being released before your baby is born as well as prompt milk flow after birth. Once breastfeeding has begun, thoughts of your baby, as well as his or her sucking or crying, can stimulate the release of milk from your milk-producing glands.  BENEFITS OF BREASTFEEDING For Your Baby  Your first milk (colostrum) helps your baby's digestive system function better.  There are antibodies in your milk that help your baby fight off infections.  Your baby has a lower incidence of asthma, allergies, and sudden infant death syndrome.  The nutrients in breast milk are better for your baby than infant formulas and are designed uniquely for your baby's needs.  Breast milk improves your baby's brain development.  Your baby is less likely to develop other conditions, such as childhood obesity, asthma, or type 2 diabetes mellitus. For You  Breastfeeding helps to create a very special bond between you and your baby.  Breastfeeding is convenient. Breast milk is always available at the correct temperature and costs nothing.  Breastfeeding helps to burn calories and helps you lose the weight gained during pregnancy.  Breastfeeding makes your uterus contract to its prepregnancy size faster and slows bleeding (lochia) after you give  birth.   Breastfeeding helps to lower your risk of developing type 2 diabetes mellitus, osteoporosis, and breast or ovarian cancer later in life. SIGNS THAT YOUR BABY IS HUNGRY Early Signs of Hunger  Increased alertness or activity.  Stretching.  Movement of the head from side to side.  Movement of the head and opening of the mouth when the corner of the mouth or cheek is stroked (rooting).  Increased sucking sounds, smacking lips, cooing, sighing, or squeaking.  Hand-to-mouth movements.  Increased sucking of fingers or hands. Late Signs of Hunger  Fussing.  Intermittent crying. Extreme Signs of  Hunger Signs of extreme hunger will require calming and consoling before your baby will be able to breastfeed successfully. Do not wait for the following signs of extreme hunger to occur before you initiate breastfeeding:  Restlessness.  A loud, strong cry.  Screaming. BREASTFEEDING BASICS Breastfeeding Initiation  Find a comfortable place to sit or lie down, with your neck and back well supported.  Place a pillow or rolled up blanket under your baby to bring him or her to the level of your breast (if you are seated). Nursing pillows are specially designed to help support your arms and your baby while you breastfeed.  Make sure that your baby's abdomen is facing your abdomen.  Gently massage your breast. With your fingertips, massage from your chest wall toward your nipple in a circular motion. This encourages milk flow. You may need to continue this action during the feeding if your milk flows slowly.  Support your breast with 4 fingers underneath and your thumb above your nipple. Make sure your fingers are well away from your nipple and your baby's mouth.  Stroke your baby's lips gently with your finger or nipple.  When your baby's mouth is open wide enough, quickly bring your baby to your breast, placing your entire nipple and as much of the colored area around your nipple (areola) as possible into your baby's mouth.  More areola should be visible above your baby's upper lip than below the lower lip.  Your baby's tongue should be between his or her lower gum and your breast.  Ensure that your baby's mouth is correctly positioned around your nipple (latched). Your baby's lips should create a seal on your breast and be turned out (everted).  It is common for your baby to suck about 2-3 minutes in order to start the flow of breast milk. Latching Teaching your baby how to latch on to your breast properly is very important. An improper latch can cause nipple pain and decreased milk  supply for you and poor weight gain in your baby. Also, if your baby is not latched onto your nipple properly, he or she may swallow some air during feeding. This can make your baby fussy. Burping your baby when you switch breasts during the feeding can help to get rid of the air. However, teaching your baby to latch on properly is still the best way to prevent fussiness from swallowing air while breastfeeding. Signs that your baby has successfully latched on to your nipple:  Silent tugging or silent sucking, without causing you pain.  Swallowing heard between every 3-4 sucks.  Muscle movement above and in front of his or her ears while sucking. Signs that your baby has not successfully latched on to nipple:  Sucking sounds or smacking sounds from your baby while breastfeeding.  Nipple pain. If you think your baby has not latched on correctly, slip your finger into the  corner of your baby's mouth to break the suction and place it between your baby's gums. Attempt breastfeeding initiation again. Signs of Successful Breastfeeding Signs from your baby:  A gradual decrease in the number of sucks or complete cessation of sucking.  Falling asleep.  Relaxation of his or her body.  Retention of a small amount of milk in his or her mouth.  Letting go of your breast by himself or herself. Signs from you:  Breasts that have increased in firmness, weight, and size 1-3 hours after feeding.  Breasts that are softer immediately after breastfeeding.  Increased milk volume, as well as a change in milk consistency and color by the fifth day of breastfeeding.  Nipples that are not sore, cracked, or bleeding. Signs That Your Pecola Leisure is Getting Enough Milk  Wetting at least 3 diapers in a 24-hour period. The urine should be clear and pale yellow by age 159 days.  At least 3 stools in a 24-hour period by age 159 days. The stool should be soft and yellow.  At least 3 stools in a 24-hour period by age 15  days. The stool should be seedy and yellow.  No loss of weight greater than 10% of birth weight during the first 63 days of age.  Average weight gain of 4-7 ounces (113-198 g) per week after age 75 days.  Consistent daily weight gain by age 159 days, without weight loss after the age of 2 weeks. After a feeding, your baby may spit up a small amount. This is common. BREASTFEEDING FREQUENCY AND DURATION Frequent feeding will help you make more milk and can prevent sore nipples and breast engorgement. Breastfeed when you feel the need to reduce the fullness of your breasts or when your baby shows signs of hunger. This is called "breastfeeding on demand." Avoid introducing a pacifier to your baby while you are working to establish breastfeeding (the first 4-6 weeks after your baby is born). After this time you may choose to use a pacifier. Research has shown that pacifier use during the first year of a baby's life decreases the risk of sudden infant death syndrome (SIDS). Allow your baby to feed on each breast as long as he or she wants. Breastfeed until your baby is finished feeding. When your baby unlatches or falls asleep while feeding from the first breast, offer the second breast. Because newborns are often sleepy in the first few weeks of life, you may need to awaken your baby to get him or her to feed. Breastfeeding times will vary from baby to baby. However, the following rules can serve as a guide to help you ensure that your baby is properly fed:  Newborns (babies 3 weeks of age or younger) may breastfeed every 1-3 hours.  Newborns should not go longer than 3 hours during the day or 5 hours during the night without breastfeeding.  You should breastfeed your baby a minimum of 8 times in a 24-hour period until you begin to introduce solid foods to your baby at around 67 months of age. BREAST MILK PUMPING Pumping and storing breast milk allows you to ensure that your baby is exclusively fed your  breast milk, even at times when you are unable to breastfeed. This is especially important if you are going back to work while you are still breastfeeding or when you are not able to be present during feedings. Your lactation consultant can give you guidelines on how long it is safe to store breast milk.  A breast pump is a machine that allows you to pump milk from your breast into a sterile bottle. The pumped breast milk can then be stored in a refrigerator or freezer. Some breast pumps are operated by hand, while others use electricity. Ask your lactation consultant which type will work best for you. Breast pumps can be purchased, but some hospitals and breastfeeding support groups lease breast pumps on a monthly basis. A lactation consultant can teach you how to hand express breast milk, if you prefer not to use a pump. CARING FOR YOUR BREASTS WHILE YOU BREASTFEED Nipples can become dry, cracked, and sore while breastfeeding. The following recommendations can help keep your breasts moisturized and healthy:  Avoid using soap on your nipples.  Wear a supportive bra. Although not required, special nursing bras and tank tops are designed to allow access to your breasts for breastfeeding without taking off your entire bra or top. Avoid wearing underwire-style bras or extremely tight bras.  Air dry your nipples for 3-704minutes after each feeding.  Use only cotton bra pads to absorb leaked breast milk. Leaking of breast milk between feedings is normal.  Use lanolin on your nipples after breastfeeding. Lanolin helps to maintain your skin's normal moisture barrier. If you use pure lanolin, you do not need to wash it off before feeding your baby again. Pure lanolin is not toxic to your baby. You may also hand express a few drops of breast milk and gently massage that milk into your nipples and allow the milk to air dry. In the first few weeks after giving birth, some women experience extremely full breasts  (engorgement). Engorgement can make your breasts feel heavy, warm, and tender to the touch. Engorgement peaks within 3-5 days after you give birth. The following recommendations can help ease engorgement:  Completely empty your breasts while breastfeeding or pumping. You may want to start by applying warm, moist heat (in the shower or with warm water-soaked hand towels) just before feeding or pumping. This increases circulation and helps the milk flow. If your baby does not completely empty your breasts while breastfeeding, pump any extra milk after he or she is finished.  Wear a snug bra (nursing or regular) or tank top for 1-2 days to signal your body to slightly decrease milk production.  Apply ice packs to your breasts, unless this is too uncomfortable for you.  Make sure that your baby is latched on and positioned properly while breastfeeding. If engorgement persists after 48 hours of following these recommendations, contact your health care provider or a Advertising copywriterlactation consultant. OVERALL HEALTH CARE RECOMMENDATIONS WHILE BREASTFEEDING  Eat healthy foods. Alternate between meals and snacks, eating 3 of each per day. Because what you eat affects your breast milk, some of the foods may make your baby more irritable than usual. Avoid eating these foods if you are sure that they are negatively affecting your baby.  Drink milk, fruit juice, and water to satisfy your thirst (about 10 glasses a day).  Rest often, relax, and continue to take your prenatal vitamins to prevent fatigue, stress, and anemia.  Continue breast self-awareness checks.  Avoid chewing and smoking tobacco. Chemicals from cigarettes that pass into breast milk and exposure to secondhand smoke may harm your baby.  Avoid alcohol and drug use, including marijuana. Some medicines that may be harmful to your baby can pass through breast milk. It is important to ask your health care provider before taking any medicine, including all  over-the-counter and prescription  medicine as well as vitamin and herbal supplements. It is possible to become pregnant while breastfeeding. If birth control is desired, ask your health care provider about options that will be safe for your baby. SEEK MEDICAL CARE IF:  You feel like you want to stop breastfeeding or have become frustrated with breastfeeding.  You have painful breasts or nipples.  Your nipples are cracked or bleeding.  Your breasts are red, tender, or warm.  You have a swollen area on either breast.  You have a fever or chills.  You have nausea or vomiting.  You have drainage other than breast milk from your nipples.  Your breasts do not become full before feedings by the fifth day after you give birth.  You feel sad and depressed.  Your baby is too sleepy to eat well.  Your baby is having trouble sleeping.   Your baby is wetting less than 3 diapers in a 24-hour period.  Your baby has less than 3 stools in a 24-hour period.  Your baby's skin or the white part of his or her eyes becomes yellow.   Your baby is not gaining weight by 235 days of age. SEEK IMMEDIATE MEDICAL CARE IF:  Your baby is overly tired (lethargic) and does not want to wake up and feed.  Your baby develops an unexplained fever.   This information is not intended to replace advice given to you by your health care provider. Make sure you discuss any questions you have with your health care provider.   Document Released: 02/12/2005 Document Revised: 11/03/2014 Document Reviewed: 08/06/2012 Elsevier Interactive Patient Education [employer] Else[employer] Inc.

## 2015-03-13 LAB — CULTURE, BETA STREP (GROUP B ONLY)

## 2015-03-14 LAB — URINE CYTOLOGY ANCILLARY ONLY
Chlamydia: NEGATIVE
Neisseria Gonorrhea: NEGATIVE

## 2015-03-16 ENCOUNTER — Encounter: Payer: BLUE CROSS/BLUE SHIELD | Admitting: Certified Nurse Midwife

## 2015-03-17 ENCOUNTER — Encounter: Payer: BLUE CROSS/BLUE SHIELD | Admitting: Obstetrics & Gynecology

## 2015-03-22 ENCOUNTER — Encounter: Payer: BLUE CROSS/BLUE SHIELD | Admitting: Physician Assistant

## 2015-03-23 ENCOUNTER — Ambulatory Visit (HOSPITAL_COMMUNITY)
Admission: RE | Admit: 2015-03-23 | Discharge: 2015-03-23 | Disposition: A | Discharge status: Home / Self Care | Payer: BLUE CROSS/BLUE SHIELD | Source: Ambulatory Visit | Attending: Obstetrics and Gynecology | Admitting: Obstetrics and Gynecology

## 2015-03-23 ENCOUNTER — Other Ambulatory Visit (HOSPITAL_COMMUNITY): Payer: Self-pay | Admitting: Maternal and Fetal Medicine

## 2015-03-23 ENCOUNTER — Other Ambulatory Visit: Payer: Self-pay | Admitting: Obstetrics and Gynecology

## 2015-03-23 DIAGNOSIS — O350XX0 Maternal care for (suspected) central nervous system malformation in fetus, not applicable or unspecified: Secondary | ICD-10-CM

## 2015-03-23 DIAGNOSIS — Z3A38 38 weeks gestation of pregnancy: Secondary | ICD-10-CM

## 2015-03-23 DIAGNOSIS — O24410 Gestational diabetes mellitus in pregnancy, diet controlled: Secondary | ICD-10-CM

## 2015-03-23 DIAGNOSIS — O09523 Supervision of elderly multigravida, third trimester: Secondary | ICD-10-CM

## 2015-03-23 DIAGNOSIS — O3503X0 Maternal care for (suspected) central nervous system malformation or damage in fetus, choroid plexus cysts, not applicable or unspecified: Secondary | ICD-10-CM

## 2015-03-23 DIAGNOSIS — O358XX0 Maternal care for other (suspected) fetal abnormality and damage, not applicable or unspecified: Secondary | ICD-10-CM | POA: Diagnosis not present

## 2015-03-23 NOTE — ED Notes (Signed)
Pt thought her appointment was today after [place] appointment.  Informed patient that her appointment was yesterday.  Called [facility] and set up appointment for patient this Friday at 9am.  Patient verbalized understanding.

## 2015-03-25 ENCOUNTER — Encounter: Payer: BLUE CROSS/BLUE SHIELD | Admitting: Obstetrics & Gynecology

## 2015-03-25 ENCOUNTER — Encounter (HOSPITAL_COMMUNITY): Payer: Self-pay

## 2015-03-25 ENCOUNTER — Encounter (HOSPITAL_COMMUNITY)
Admission: AD | Disposition: A | Discharge status: Home / Self Care | Payer: Self-pay | Source: Ambulatory Visit | Attending: Obstetrics & Gynecology

## 2015-03-25 ENCOUNTER — Inpatient Hospital Stay (HOSPITAL_COMMUNITY): Payer: BLUE CROSS/BLUE SHIELD | Admitting: Anesthesiology

## 2015-03-25 ENCOUNTER — Inpatient Hospital Stay (HOSPITAL_COMMUNITY)
Admission: AD | Admit: 2015-03-25 | Discharge: 2015-03-26 | DRG: 775 | Disposition: A | Discharge status: Home / Self Care | Payer: BLUE CROSS/BLUE SHIELD | Source: Ambulatory Visit | Attending: Family Medicine | Admitting: Family Medicine

## 2015-03-25 DIAGNOSIS — Z823 Family history of stroke: Secondary | ICD-10-CM

## 2015-03-25 DIAGNOSIS — O24420 Gestational diabetes mellitus in childbirth, diet controlled: Principal | ICD-10-CM | POA: Diagnosis present

## 2015-03-25 DIAGNOSIS — Z8632 Personal history of gestational diabetes: Secondary | ICD-10-CM | POA: Diagnosis present

## 2015-03-25 DIAGNOSIS — Z3A38 38 weeks gestation of pregnancy: Secondary | ICD-10-CM

## 2015-03-25 DIAGNOSIS — O24410 Gestational diabetes mellitus in pregnancy, diet controlled: Secondary | ICD-10-CM

## 2015-03-25 DIAGNOSIS — O99214 Obesity complicating childbirth: Secondary | ICD-10-CM | POA: Diagnosis present

## 2015-03-25 DIAGNOSIS — O09529 Supervision of elderly multigravida, unspecified trimester: Secondary | ICD-10-CM

## 2015-03-25 DIAGNOSIS — O0993 Supervision of high risk pregnancy, unspecified, third trimester: Secondary | ICD-10-CM

## 2015-03-25 DIAGNOSIS — IMO0001 Reserved for inherently not codable concepts without codable children: Secondary | ICD-10-CM

## 2015-03-25 DIAGNOSIS — Z6831 Body mass index (BMI) 31.0-31.9, adult: Secondary | ICD-10-CM | POA: Diagnosis not present

## 2015-03-25 DIAGNOSIS — Z302 Encounter for sterilization: Secondary | ICD-10-CM | POA: Diagnosis not present

## 2015-03-25 HISTORY — PX: TUBAL LIGATION: SHX77

## 2015-03-25 LAB — CBC
HCT: 38.3 % (ref 36.0–46.0)
Hemoglobin: 13.1 g/dL (ref 12.0–15.0)
MCH: 29.8 pg (ref 26.0–34.0)
MCHC: 34.2 g/dL (ref 30.0–36.0)
MCV: 87 fL (ref 78.0–100.0)
PLATELETS: 295 10*3/uL (ref 150–400)
RBC: 4.4 MIL/uL (ref 3.87–5.11)
RDW: 13.6 % (ref 11.5–15.5)
WBC: 8.6 10*3/uL (ref 4.0–10.5)

## 2015-03-25 LAB — GLUCOSE, CAPILLARY
Glucose-Capillary: 97 mg/dL (ref 65–99)
Glucose-Capillary: 86 mg/dL (ref 65–99)

## 2015-03-25 LAB — RPR: RPR Ser Ql: NONREACTIVE

## 2015-03-25 SURGERY — LIGATION, FALLOPIAN TUBE, POSTPARTUM
Anesthesia: Spinal | Site: Abdomen | Laterality: Bilateral

## 2015-03-25 MED ORDER — LIDOCAINE HCL (PF) 1 % IJ SOLN
30.0000 mL | INTRAMUSCULAR | Status: DC | PRN
  Administered 2015-03-25: 30 mL via SUBCUTANEOUS
  Filled 2015-03-25: qty 30

## 2015-03-25 MED ORDER — DIPHENHYDRAMINE HCL 25 MG PO CAPS: 25.0000 mg | ORAL_CAPSULE | Freq: Four times a day (QID) | ORAL | Status: DC | PRN

## 2015-03-25 MED ORDER — MIDAZOLAM HCL 2 MG/2ML IJ SOLN
INTRAMUSCULAR | Status: AC
  Filled 2015-03-25: qty 2

## 2015-03-25 MED ORDER — FLEET ENEMA 7-19 GM/118ML RE ENEM: 1.0000 | ENEMA | RECTAL | Status: DC | PRN

## 2015-03-25 MED ORDER — LANOLIN HYDROUS EX OINT: TOPICAL_OINTMENT | CUTANEOUS | Status: DC | PRN

## 2015-03-25 MED ORDER — BENZOCAINE-MENTHOL 20-0.5 % EX AERO
1.0000 "application " | INHALATION_SPRAY | CUTANEOUS | Status: DC | PRN
  Administered 2015-03-25: 1 via TOPICAL
  Filled 2015-03-25 (×2): qty 56

## 2015-03-25 MED ORDER — METHYLERGONOVINE MALEATE 0.2 MG PO TABS: 0.2000 mg | ORAL_TABLET | ORAL | Status: DC | PRN

## 2015-03-25 MED ORDER — TETANUS-DIPHTH-ACELL PERTUSSIS 5-2.5-18.5 LF-MCG/0.5 IM SUSP: 0.5000 mL | Freq: Once | INTRAMUSCULAR | Status: DC

## 2015-03-25 MED ORDER — CITRIC ACID-SODIUM CITRATE 334-500 MG/5ML PO SOLN: 30.0000 mL | ORAL | Status: DC | PRN

## 2015-03-25 MED ORDER — BUPIVACAINE HCL (PF) 0.25 % IJ SOLN
INTRAMUSCULAR | Status: AC
  Filled 2015-03-25: qty 30

## 2015-03-25 MED ORDER — FENTANYL CITRATE (PF) 100 MCG/2ML IJ SOLN
INTRAMUSCULAR | Status: DC | PRN
  Administered 2015-03-25 (×2): 50 ug via INTRAVENOUS

## 2015-03-25 MED ORDER — MEPERIDINE HCL 25 MG/ML IJ SOLN: 6.2500 mg | INTRAMUSCULAR | Status: DC | PRN

## 2015-03-25 MED ORDER — LACTATED RINGERS IV SOLN
INTRAVENOUS | Status: DC
  Administered 2015-03-25: 06:00:00 via INTRAVENOUS

## 2015-03-25 MED ORDER — MEASLES, MUMPS & RUBELLA VAC ~~LOC~~ INJ
0.5000 mL | INJECTION | Freq: Once | SUBCUTANEOUS | Status: DC
  Filled 2015-03-25: qty 0.5

## 2015-03-25 MED ORDER — OXYTOCIN BOLUS FROM INFUSION
500.0000 mL | INTRAVENOUS | Status: DC
  Administered 2015-03-25: 500 mL via INTRAVENOUS

## 2015-03-25 MED ORDER — ONDANSETRON HCL 4 MG/2ML IJ SOLN
INTRAMUSCULAR | Status: DC | PRN
  Administered 2015-03-25: 4 mg via INTRAVENOUS

## 2015-03-25 MED ORDER — FENTANYL CITRATE (PF) 100 MCG/2ML IJ SOLN
INTRAMUSCULAR | Status: AC
  Filled 2015-03-25: qty 2

## 2015-03-25 MED ORDER — ONDANSETRON HCL 4 MG PO TABS: 4.0000 mg | ORAL_TABLET | ORAL | Status: DC | PRN

## 2015-03-25 MED ORDER — DIBUCAINE 1 % RE OINT
1.0000 "application " | TOPICAL_OINTMENT | RECTAL | Status: DC | PRN
  Filled 2015-03-25: qty 28

## 2015-03-25 MED ORDER — WITCH HAZEL-GLYCERIN EX PADS
1.0000 | MEDICATED_PAD | CUTANEOUS | Status: DC | PRN
Start: 2015-03-25 — End: 2015-03-26

## 2015-03-25 MED ORDER — FENTANYL CITRATE (PF) 100 MCG/2ML IJ SOLN: 25.0000 ug | INTRAMUSCULAR | Status: DC | PRN

## 2015-03-25 MED ORDER — ACETAMINOPHEN 325 MG PO TABS: 650.0000 mg | ORAL_TABLET | ORAL | Status: DC | PRN

## 2015-03-25 MED ORDER — FAMOTIDINE 20 MG PO TABS
40.0000 mg | ORAL_TABLET | Freq: Once | ORAL | Status: AC
  Administered 2015-03-25: 40 mg via ORAL
  Filled 2015-03-25: qty 2

## 2015-03-25 MED ORDER — BISACODYL 10 MG RE SUPP
10.0000 mg | Freq: Every day | RECTAL | Status: DC | PRN
  Filled 2015-03-25: qty 1

## 2015-03-25 MED ORDER — ONDANSETRON HCL 4 MG/2ML IJ SOLN: 4.0000 mg | INTRAMUSCULAR | Status: DC | PRN

## 2015-03-25 MED ORDER — MIDAZOLAM HCL 2 MG/2ML IJ SOLN: 0.5000 mg | Freq: Once | INTRAMUSCULAR | Status: DC | PRN

## 2015-03-25 MED ORDER — BUPIVACAINE IN DEXTROSE 0.75-8.25 % IT SOLN
INTRATHECAL | Status: DC | PRN
  Administered 2015-03-25: 15 mL via INTRATHECAL

## 2015-03-25 MED ORDER — PROMETHAZINE HCL 25 MG/ML IJ SOLN: 6.2500 mg | INTRAMUSCULAR | Status: DC | PRN

## 2015-03-25 MED ORDER — OXYTOCIN 10 UNIT/ML IJ SOLN
2.5000 [IU]/h | INTRAVENOUS | Status: DC
  Filled 2015-03-25: qty 4

## 2015-03-25 MED ORDER — FENTANYL CITRATE (PF) 100 MCG/2ML IJ SOLN: 50.0000 ug | INTRAMUSCULAR | Status: DC | PRN

## 2015-03-25 MED ORDER — OXYCODONE-ACETAMINOPHEN 5-325 MG PO TABS: 1.0000 | ORAL_TABLET | ORAL | Status: DC | PRN

## 2015-03-25 MED ORDER — METOCLOPRAMIDE HCL 10 MG PO TABS
10.0000 mg | ORAL_TABLET | Freq: Once | ORAL | Status: AC
  Administered 2015-03-25: 10 mg via ORAL
  Filled 2015-03-25: qty 1

## 2015-03-25 MED ORDER — ONDANSETRON HCL 4 MG/2ML IJ SOLN
INTRAMUSCULAR | Status: AC
  Filled 2015-03-25: qty 2

## 2015-03-25 MED ORDER — PRENATAL MULTIVITAMIN CH
1.0000 | ORAL_TABLET | Freq: Every day | ORAL | Status: DC
  Administered 2015-03-26: 1 via ORAL
  Filled 2015-03-25: qty 1

## 2015-03-25 MED ORDER — MIDAZOLAM HCL 2 MG/2ML IJ SOLN
INTRAMUSCULAR | Status: DC | PRN
  Administered 2015-03-25: 2 mg via INTRAVENOUS

## 2015-03-25 MED ORDER — FLEET ENEMA 7-19 GM/118ML RE ENEM: 1.0000 | ENEMA | Freq: Every day | RECTAL | Status: DC | PRN

## 2015-03-25 MED ORDER — OXYCODONE-ACETAMINOPHEN 5-325 MG PO TABS: 2.0000 | ORAL_TABLET | ORAL | Status: DC | PRN

## 2015-03-25 MED ORDER — LACTATED RINGERS IV SOLN: 500.0000 mL | INTRAVENOUS | Status: DC | PRN

## 2015-03-25 MED ORDER — IBUPROFEN 600 MG PO TABS
600.0000 mg | ORAL_TABLET | Freq: Four times a day (QID) | ORAL | Status: DC
  Administered 2015-03-25 – 2015-03-26 (×4): 600 mg via ORAL
  Filled 2015-03-25 (×4): qty 1

## 2015-03-25 MED ORDER — FERROUS SULFATE 325 (65 FE) MG PO TABS
325.0000 mg | ORAL_TABLET | Freq: Two times a day (BID) | ORAL | Status: DC
  Administered 2015-03-25 – 2015-03-26 (×2): 325 mg via ORAL
  Filled 2015-03-25 (×3): qty 1

## 2015-03-25 MED ORDER — BUPIVACAINE HCL (PF) 0.25 % IJ SOLN
INTRAMUSCULAR | Status: DC | PRN
Start: 2015-03-25 — End: 2015-03-25
  Administered 2015-03-25: 4 mL
  Administered 2015-03-25: 1 mL

## 2015-03-25 MED ORDER — METHYLERGONOVINE MALEATE 0.2 MG/ML IJ SOLN: 0.2000 mg | INTRAMUSCULAR | Status: DC | PRN

## 2015-03-25 MED ORDER — SENNOSIDES-DOCUSATE SODIUM 8.6-50 MG PO TABS
2.0000 | ORAL_TABLET | ORAL | Status: DC
  Administered 2015-03-25: 2 via ORAL
  Filled 2015-03-25 (×2): qty 2

## 2015-03-25 MED ORDER — ZOLPIDEM TARTRATE 5 MG PO TABS: 5.0000 mg | ORAL_TABLET | Freq: Every evening | ORAL | Status: DC | PRN

## 2015-03-25 MED ORDER — LACTATED RINGERS IV SOLN
INTRAVENOUS | Status: DC
  Administered 2015-03-25 (×3): via INTRAVENOUS

## 2015-03-25 MED ORDER — SIMETHICONE 80 MG PO CHEW: 80.0000 mg | CHEWABLE_TABLET | ORAL | Status: DC | PRN

## 2015-03-25 MED ORDER — ONDANSETRON HCL 4 MG/2ML IJ SOLN: 4.0000 mg | Freq: Four times a day (QID) | INTRAMUSCULAR | Status: DC | PRN

## 2015-03-25 SURGICAL SUPPLY — 20 items
BLADE SURG 11 STRL SS (BLADE) ×2 IMPLANT
CHLORAPREP W/TINT 26ML (MISCELLANEOUS) ×2 IMPLANT
CLIP FILSHIE TUBAL LIGA STRL (Clip) ×2 IMPLANT
CLOTH BEACON ORANGE TIMEOUT ST (SAFETY) ×2 IMPLANT
DRSG OPSITE POSTOP 3X4 (GAUZE/BANDAGES/DRESSINGS) ×2 IMPLANT
GLOVE BIO SURGEON STRL SZ 6.5 (GLOVE) ×2 IMPLANT
GLOVE BIOGEL PI IND STRL 7.0 (GLOVE) ×2 IMPLANT
GLOVE BIOGEL PI INDICATOR 7.0 (GLOVE) ×2
GOWN STRL REUS W/TWL LRG LVL3 (GOWN DISPOSABLE) ×4 IMPLANT
NEEDLE HYPO 22GX1.5 SAFETY (NEEDLE) ×2 IMPLANT
NS IRRIG 1000ML POUR BTL (IV SOLUTION) ×2 IMPLANT
PACK ABDOMINAL MINOR (CUSTOM PROCEDURE TRAY) ×2 IMPLANT
SPONGE LAP 4X18 X RAY DECT (DISPOSABLE) ×2 IMPLANT
SUT VIC AB 0 CT1 27 (SUTURE) ×1
SUT VIC AB 0 CT1 27XBRD ANBCTR (SUTURE) ×1 IMPLANT
SUT VICRYL 4-0 PS2 18IN ABS (SUTURE) ×2 IMPLANT
SYR CONTROL 10ML LL (SYRINGE) ×2 IMPLANT
TOWEL OR 17X24 6PK STRL BLUE (TOWEL DISPOSABLE) ×4 IMPLANT
TRAY FOLEY BAG SILVER LF 16FR (SET/KITS/TRAYS/PACK) ×2 IMPLANT
WATER STERILE IRR 1000ML POUR (IV SOLUTION) ×2 IMPLANT

## 2015-03-25 NOTE — Anesthesia Preprocedure Evaluation (Addendum)
Anesthesia Evaluation  Patient identified by MRN, date of birth, ID band Patient awake    Reviewed: Allergy & Precautions, NPO status , Patient's Chart, lab work & pertinent test results  History of Anesthesia Complications Negative for: history of anesthetic complications  Airway Mallampati: I  TM Distance: >3 FB Neck ROM: Full    Dental no notable dental hx. (+) Dental Advisory Given   Pulmonary neg pulmonary ROS,    Pulmonary exam normal        Cardiovascular negative cardio ROS Normal cardiovascular exam     Neuro/Psych negative neurological ROS     GI/Hepatic negative GI ROS, Neg liver ROS,   Endo/Other  diabetes (glu 86, gestational)Morbid obesity  Renal/GU negative Renal ROS     Musculoskeletal negative musculoskeletal ROS (+)   Abdominal Normal abdominal exam  (+)   Peds  Hematology   Anesthesia Other Findings   Reproductive/Obstetrics Post partum                            Anesthesia Physical Anesthesia Plan  ASA: II  Anesthesia Plan: Spinal   Post-op Pain Management:    Induction:   Airway Management Planned: Simple Face Mask  Additional Equipment:   Intra-op Plan:   Post-operative Plan:   Informed Consent: I have reviewed the patients History and Physical, chart, labs and discussed the procedure including the risks, benefits and alternatives for the proposed anesthesia with the patient or authorized representative who has indicated his/her understanding and acceptance.   Dental advisory given  Plan Discussed with: CRNA and Surgeon  Anesthesia Plan Comments: (Plan routine monitors, SAB)        Anesthesia Quick Evaluation

## 2015-03-25 NOTE — Progress Notes (Signed)
40 y.o. [name] [name] with undesired fertility desires permanent sterilization. Risks and benefits of postpartum tubal sterilization procedure was discussed with the patient including permanence of method, bleeding, infection, injury to surrounding organs, anesthesia and need for additional procedures. Risk failure of 0.5-1% with increased risk of ectopic gestation if pregnancy occurs was also discussed with patient. Patient verbalized understanding and all questions were answered.  [name] Debroah Loop MD 03/25/2015 9:49 AM

## 2015-03-25 NOTE — Transfer of Care (Signed)
Immediate Anesthesia Transfer of Care Note  Patient: [name] [name]  Procedure(s) Performed: Procedure(s): POST PARTUM TUBAL LIGATION (Bilateral)  Patient Location: PACU  Anesthesia Type:Spinal  Level of Consciousness: awake, alert  and oriented  Airway & Oxygen Therapy: Patient Spontanous Breathing  Post-op Assessment: Report given to RN and Post -op Vital signs reviewed and stable  Post vital signs: Reviewed and stable  Last Vitals:  Filed Vitals:   03/25/15 0915 03/25/15 1015  BP: 120/79 129/79  Pulse: 81 78  Temp: 36.8 C 36.7 C  Resp: 14 12    Complications: No apparent anesthesia complications

## 2015-03-25 NOTE — Lactation Note (Signed)
This note was copied from the chart of [name] [name]. Lactation Consultation Note  Patient Name: [name] [name] Date: 03/25/2015 Reason for consult: Initial assessment   Initial consult with Exp BF mom of 5. Mom breast and bottle fed others as she said she did not have enough milk. Enc mom to feed infant 8-12 x in 24 hours at first feeding cues. Infant asleep on mom's chest STS. Awakened him to feed by changing diaper, he went back to mom's chest and went back to sleep. Mom with compressible small breasts and everted larger nipples. Mom did well with positioning infant at breast. LC Brochure given, Discussed BF support groups, OP services and phone #. Mom is maintaining feeding log. She plans to apply for [facility] after d/c. She does not have a pump at home. Mom knows to call for assistance as needed. Mom did have BTL this am, she was sitting in bed and was independent with infant care.    Maternal Data Formula Feeding for Exclusion: No Does the patient have breastfeeding experience prior to this delivery?: Yes  Feeding Feeding Type: Breast Fed Length of feed: 0 min  LATCH Score/Interventions Latch: Too sleepy or reluctant, no latch achieved, no sucking elicited.  Audible Swallowing: None  Type of Nipple: Everted at rest and after stimulation  Comfort (Breast/Nipple): Soft / non-tender     Hold (Positioning): No assistance needed to correctly position infant at breast.  LATCH Score: 6  Lactation Tools Discussed/Used WIC Program: Yes (Plans to apply)   Consult Status Consult Status: Follow-up Date: 03/26/15 Follow-up type: In-patient    [name] [name] 03/25/2015, 4:14 PM

## 2015-03-25 NOTE — Anesthesia Procedure Notes (Signed)
Spinal Patient location during procedure: OR Start time: 03/25/2015 11:12 AM End time: 03/25/2015 11:16 AM Staffing Anesthesiologist: Jairo Ben Performed by: anesthesiologist  Preanesthetic Checklist Completed: patient identified, surgical consent, pre-op evaluation, timeout performed, IV checked, risks and benefits discussed and monitors and equipment checked Spinal Block Patient position: sitting Prep: Betadine Patient monitoring: heart rate, cardiac monitor, continuous pulse ox and blood pressure Approach: midline Location: L3-4 Injection technique: single-shot Needle Needle type: Pencan  Needle gauge: 24 G Needle length: 10 cm Additional Notes Pt identified in operating room.  Monitors applied. Working IV access confirmed. Sterile prep, drape Lumbar spine.  1% lido local L 3,4.  #24ga Pencan into clear CSF first pass.   Bupivacaine with dextrose into clear CSF, asp beginning/end of injection. Patient asymptomatic, VSS, no heme aspirated, tolerated well.  Sandford Craze, MD

## 2015-03-25 NOTE — H&P (Signed)
[name] [name] is a 40 y.o. female [phone] with IUP at 38w6dpresenting for contractions. Pt states she has been having regular, every 2 minutes contractions, associated with none vaginal bleeding.  Membranes are intact, with active fetal movement.   [facility]  Prenatal History/Complications: AT1YHPast Medical History: Past Medical History  Diagnosis Date  . Medical history non-contributory   . Gestational diabetes mellitus (GDM), antepartum     Past Surgical History: Past Surgical History  Procedure Laterality Date  . No past surgeries    . None      Obstetrical History: OB History    Gravida Para Term Preterm AB TAB SAB Ectopic Multiple Living   5 4 4       4        Social History: Social History   Social History  . Marital Status: Married    Spouse Name: N/A  . Number of Children: N/A  . Years of Education: N/A   Social History Main Topics  . Smoking status: Never Smoker   . Smokeless tobacco: None  . Alcohol Use: No  . Drug Use: No  . Sexual Activity:    Partners: Male    Birth Control/ Protection: Condom, None   Other Topics Concern  . None   Social History Narrative    Family History: Family History  Problem Relation Age of Onset  . Stroke Father     Allergies: No Known Allergies  Prescriptions prior to admission  Medication Sig Dispense Refill Last Dose  . Blood Glucose Monitoring Suppl (ACCU-CHEK AVIVA PLUS) w/Device KIT FPD  0   . EZ SMART BLOOD GLUCOSE LANCETS MISC Use as directed. 100 each 12 Taking  . glucose blood test strip Use as instructed 100 each 12 Taking  . Prenatal Vit-Fe Fumarate-FA (MULTIVITAMIN-PRENATAL) 27-0.8 MG TABS tablet Take 1 tablet by mouth daily at 12 noon.   Taking     Prenatal Transfer Tool  Maternal Diabetes: Yes:  Diabetes Type:  Diet controlled Genetic Screening: Declined Maternal Ultrasounds/Referrals: Normal Fetal Ultrasounds or other Referrals:  None Maternal Substance Abuse:   No Significant Maternal Medications:  None Significant Maternal Lab Results: None     Review of Systems   Constitutional: Negative for fever and chills Eyes: Negative for visual disturbances Respiratory: Negative for shortness of breath, dyspnea Cardiovascular: Negative for chest pain or palpitations  Gastrointestinal: Negative for vomiting, diarrhea and constipation.  POSITIVE for abdominal pain (contractions) Genitourinary: Negative for dysuria and urgency Musculoskeletal: Negative for back pain, joint pain, myalgias  Neurological: Negative for dizziness and headaches      Blood pressure 144/83, pulse 81, temperature 98.2 F (36.8 C), temperature source Oral, resp. rate 18, height 5' 4"  (1.626 m), weight 82.101 kg (181 lb), last menstrual period 06/26/2014, currently breastfeeding. General appearance: alert, cooperative and moderate distress Lungs: clear to auscultation bilaterally Heart: regular rate and rhythm Abdomen: soft, non-tender; bowel sounds normal Pelvic: 7/100/-1 Extremities: Homans sign is negative, no sign of DVT DTR's 2+ Presentation: cephalic Fetal monitoring  Baseline: 140 bpm, Variability: Good {> 6 bpm), Accelerations: Reactive and Decelerations: Absent Uterine activity   Strong q 2-3 mintues    Prenatal labs: ABO, Rh: O/POS/-- (06/01 1057) Antibody: NEG (06/01 1057) Rubella: !Error! RPR: NON REAC (11/18 0934)  HBsAg: NEGATIVE (06/01 1057)  HIV: NONREACTIVE (11/18 0934)   CMcGuffeyPrenatal Labs  Dating LMP c/w 9 week scan Blood type: O/POS/-- (06/01 1057)   Genetic Screen Declined Antibody:NEG (06/01  1057)  Anatomic [place] Normal except for bilateral CPC -> resolved at 26 week scan Rubella: 12.00 (06/01 1057)  GTT Third trimester: 139 3 hour abnormal -> GDM RPR: NON REAC (06/01 1057)   Flu vaccine 11/12/14 HBsAg: NEGATIVE (06/01 1057)   TDaP vaccine  01/14/15                                    HIV: NONREACTIVE (06/01 1057)   Baby Food  Breast and bottle                                            GBS: Negative  Contraception BTS (has BCBS) MNA:RUOOWI pap smear and negative high-risk HPV on 09/02/14  Circumcision No   Pediatrician [place]   Support Person Husband     No results found for this or any previous visit (from the past 24 hour(s)).  Assessment: [name] [name] is a 40 y.o. [id number] with an IUP at 44w6dpresenting for active labor  Plan: #Labor: expectant management #Pain:  Per request #FWB Cat 1    [name] [name] 03/25/2015, 6:22 AM

## 2015-03-25 NOTE — Addendum Note (Signed)
Addendum  created 03/25/15 1408 by Leilani Able, MD   Modules edited: Anesthesia Review and Sign Navigator Section, Clinical Notes   Clinical Notes:  File: [id number]

## 2015-03-25 NOTE — Anesthesia Postprocedure Evaluation (Signed)
Anesthesia Post Note  Patient: [name] [name]  Procedure(s) Performed: Procedure(s) (LRB): POST PARTUM TUBAL LIGATION (Bilateral)  Patient location during evaluation: PACU Anesthesia Type: Spinal Level of consciousness: awake Pain management: pain level controlled Vital Signs Assessment: post-procedure vital signs reviewed and stable Respiratory status: spontaneous breathing Cardiovascular status: stable Postop Assessment: no headache, no backache, spinal receding, patient able to bend at knees and no signs of nausea or vomiting Anesthetic complications: no    Last Vitals:  Filed Vitals:   03/25/15 1345 03/25/15 1400  BP: 112/73 103/86  Pulse:    Temp: 36.9 C   Resp: 18     Last Pain: There were no vitals filed for this visit.               Hani Patnode JR,JOHN Susann Givens

## 2015-03-25 NOTE — MAU Note (Signed)
Ctx, denies LOF or VB. +FM 

## 2015-03-26 ENCOUNTER — Ambulatory Visit: Payer: Self-pay

## 2015-03-26 DIAGNOSIS — Z302 Encounter for sterilization: Secondary | ICD-10-CM

## 2015-03-26 DIAGNOSIS — IMO0001 Reserved for inherently not codable concepts without codable children: Secondary | ICD-10-CM

## 2015-03-26 MED ORDER — SENNOSIDES-DOCUSATE SODIUM 8.6-50 MG PO TABS: 2.0000 | ORAL_TABLET | Freq: Every evening | ORAL | Status: DC | PRN

## 2015-03-26 MED ORDER — ACETAMINOPHEN 325 MG PO TABS: 650.0000 mg | ORAL_TABLET | ORAL | Status: AC | PRN

## 2015-03-26 MED ORDER — IBUPROFEN 600 MG PO TABS: 600.0000 mg | ORAL_TABLET | Freq: Four times a day (QID) | ORAL | Status: AC

## 2015-03-26 NOTE — Lactation Note (Signed)
This note was copied from the chart of Boy Midge Momon. Lactation Consultation Note  Patient Name: Boy Tabetha Haraway [id number] Date: 03/26/2015 Reason for consult: Follow-up assessment Infant is 56 hours old and seen by [facility] for follow-up assessment. Mom was changing baby when [facility] entered. Mom stated that BF is going well and has no concerns. This is mom's 5th child & she reports BF her other children for ~2-3 months. Mom was not interested in latching baby at this time. Reviewed feeding on cue & O/P lactation services. Encouraged pt to call for LC at future feed if she wanted help.   Maternal Data    Feeding Feeding Type: Breast Fed Length of feed: 45 min  LATCH Score/Interventions                      Lactation Tools Discussed/Used     Consult Status Consult Status: Complete    Oneal Grout 03/26/2015, 1:50 PM

## 2015-03-26 NOTE — Discharge Summary (Signed)
OB Discharge Summary     Patient Name: [name] [name] DOB: May 30, 1975 MRN: [id number]  Date of admission: 03/25/2015 Delivering MD: Christin Fudge   Date of discharge: 03/26/2015  Admitting diagnosis: 39 WEEKS PINK BLOOD PAIN desires sterilization Intrauterine pregnancy: [redacted]w[redacted]d    Secondary diagnosis:  Active Problems:   AMA (advanced maternal age) multigravida 35+   Gestational diabetes mellitus (GDM), antepartum   Status post normal vaginal delivery  Additional problems: none     Discharge diagnosis: Term Pregnancy Delivered                                                                                                Post partum procedures:none  Augmentation: nonoe  Complications: None  Hospital course:  Onset of Labor With Vaginal Delivery     40y.o. yo G5P5005 at 355w6das admitted in Active Labor on 03/25/2015. Patient had an uncomplicated labor course as follows:  Membrane Rupture Time/Date:   ,03/25/2015   Intrapartum Procedures: Episiotomy: None [1]                                         Lacerations:  1st degree [2];Perineal [11]  Patient had a delivery of a Viable infant. 03/25/2015  Information for the patient's newborn:  TrDevanee, [place]0[[id number]]Delivery Method: Vaginal, Spontaneous Delivery (Filed from Delivery Summary)    Pateint had an uncomplicated postpartum course.  She is ambulating, tolerating a regular diet, passing flatus, and urinating well. Patient is discharged home in stable condition on 03/26/2015.     Physical exam  Filed Vitals:   03/25/15 1530 03/25/15 2217 03/26/15 0211 03/26/15 0500  BP: 119/72 100/59 108/67 141/91  Pulse: 74 84 76 76  Temp: 98.3 F (36.8 C) 98.5 F (36.9 C) 98.7 F (37.1 C) 97.9 F (36.6 C)  TempSrc: Oral Oral Oral Oral  Resp: 16 18 14 18   Height:      Weight:      SpO2: 97% 96% 95% 98%   General: alert, cooperative and no distress Lochia: appropriate Uterine Fundus: firm Incision: Healing  well with no significant drainage, No significant erythema, Dressing is clean, dry, and intact DVT Evaluation: No cords or calf tenderness. No significant calf/ankle edema. Labs: Lab Results  Component Value Date   WBC 8.6 03/25/2015   HGB 13.1 03/25/2015   HCT 38.3 03/25/2015   MCV 87.0 03/25/2015   PLT 295 03/25/2015   No flowsheet data found.  Discharge instruction: per After Visit Summary and "Baby and Me Booklet".  After visit meds:    Medication List    STOP taking these medications        ACCU-CHEK AVIVA PLUS w/Device Kit     EZ SMART BLOOD GLUCOSE LANCETS Misc     glucose blood test strip      TAKE these medications        acetaminophen 325 MG tablet  Commonly known as:  TYLENOL  Take 2 tablets (650 mg total) by mouth  every 4 (four) hours as needed (for pain scale < 4).     ibuprofen 600 MG tablet  Commonly known as:  ADVIL,MOTRIN  Take 1 tablet (600 mg total) by mouth every 6 (six) hours.     multivitamin-prenatal 27-0.8 MG Tabs tablet  Take 1 tablet by mouth daily at 12 noon.     senna-docusate 8.6-50 MG tablet  Commonly known as:  Senokot-S  Take 2 tablets by mouth at bedtime as needed for mild constipation.        Diet: routine diet  Activity: Advance as tolerated. Pelvic rest for 6 weeks.   Outpatient follow up:6 weeks, will need GTT Follow up Appt:No future appointments. Follow up Visit:No Follow-up on file.  Postpartum contraception: Tubal Ligation  Newborn Data: Live born female  Birth Weight: 6 lb 3.2 oz (2812 g) APGAR: 8, 9  Baby Feeding: Breast Disposition:home with mother   03/26/2015 Desma Maxim, MD

## 2015-03-26 NOTE — Anesthesia Postprocedure Evaluation (Signed)
Anesthesia Post Note  Patient: [name] [name]  Procedure(s) Performed: Procedure(s) (LRB): POST PARTUM TUBAL LIGATION (Bilateral)  Patient location during evaluation: [facility] Anesthesia Type: Spinal Level of consciousness: awake and alert, oriented and patient cooperative Pain management: pain level controlled Vital Signs Assessment: post-procedure vital signs reviewed and stable Respiratory status: spontaneous breathing Cardiovascular status: stable Postop Assessment: no headache, patient able to bend at knees, no signs of nausea or vomiting and spinal receding Anesthetic complications: no    Last Vitals:  Filed Vitals:   03/26/15 0211 03/26/15 0500  BP: 108/67 141/91  Pulse: 76 76  Temp: 37.1 C 36.6 C  Resp: 14 18    Last Pain:  Filed Vitals:   03/26/15 0620  PainSc: 4                  Verlia Kaney

## 2015-03-26 NOTE — Discharge Planning (Signed)
Discharge teaching complete. Pt understood all instructions and did not have any questions. Pt ambulated out of the hospital and discharged home to family.  

## 2015-03-26 NOTE — Addendum Note (Signed)
Addendum  created 03/26/15 0738 by Angela Adam, CRNA   Modules edited: Clinical Notes   Clinical Notes:  File: [id number]

## 2015-03-26 NOTE — Discharge Instructions (Signed)
Postpartum Tubal Ligation, Care After Refer to this sheet in the next few weeks. These instructions provide you with information about caring for yourself after your procedure. Your health care provider may also give you more specific instructions. Your treatment has been planned according to current medical practices, but problems sometimes occur. Call your health care provider if you have any problems or questions after your procedure. WHAT TO EXPECT AFTER THE PROCEDURE After your procedure, it is common to have:  Sore throat.  Soreness at the incision site.  Mild cramping.  Tiredness.  Mild nausea or vomiting. HOME CARE INSTRUCTIONS  Rest for the remainder of the day.  Take medicines only as directed by your health care provider. These include over-the-counter medicines and prescription medicines. Do not take aspirin, which can cause bleeding.  Over the next few days, gradually return to your normal activities and your normal diet.  Avoid sexual intercourse for 2 weeks or as directed by your health care provider.  Do not drive or operate heavy machinery while taking pain medicine.  Do not lift anything that is heavier than 5 lb (2.3 kg) for 2 weeks or as directed by your health care provider.  Do not take baths. Take showers only. Ask your health care provider when you can start taking baths.  Take your temperature twice each day and write it down.  Try to have help for the first 7-10 days for your household needs.  There are many different ways to close and cover an incision, including stitches (sutures), skin glue, and adhesive strips. Follow instructions from your health care provider about:  Incision care.  Bandage (dressing) changes and removal.  Incision closure removal.  Check your incision area every day for signs of infection. Watch for:  Redness, swelling, or pain.  Fluid, blood, or pus.  Keep all follow-up visits as directed by your health care  provider. SEEK MEDICAL CARE IF:  You have redness, swelling, or increasing pain in your incision area.  You have fluid or pus coming from your incision for longer than 1 day.  You notice a bad smell coming from your incision or your dressing.  The edges of your incision break open after the sutures have been removed.  Your pain does not decrease after 2-3 days.  You have a rash.  You repeatedly become dizzy or light-headed.  You have a reaction to your medicine.  Your pain medicine is not helping.  You are constipated. SEEK IMMEDIATE MEDICAL CARE IF:   You have a fever.  You faint.  You have increasing pain in your abdomen.  You have bleeding or drainage from your suture sites or your vagina after surgery.  You have shortness of breath or have difficulty breathing.  You have chest pain or leg pain.  You have ongoing nausea, vomiting, or diarrhea.   This information is not intended to replace advice given to you by your health care provider. Make sure you discuss any questions you have with your health care provider.   Document Released: 08/14/2011 Document Revised: 06/29/2014 Document Reviewed: 08/14/2011 Elsevier Interactive Patient Education 2016 Elsevier Inc. Vaginal Delivery, Care After Refer to this sheet in the next few weeks. These discharge instructions provide you with information on caring for yourself after delivery. Your health care provider may also give you specific instructions. Your treatment has been planned according to the most current medical practices available, but problems sometimes occur. Call your health care provider if you have any problems or questions  after you go home. °HOME CARE INSTRUCTIONS °· Take over-the-counter or prescription medicines only as directed by your health care provider or pharmacist. °· Do not drink alcohol, especially if you are breastfeeding or taking medicine to relieve pain. °· Do not chew or smoke tobacco. °· Do not use  illegal drugs. °· Continue to use good perineal care. Good perineal care includes: °¨ Wiping your perineum from front to back. °¨ Keeping your perineum clean. °· Do not use tampons or douche until your health care provider says it is okay. °· Shower, wash your hair, and take tub baths as directed by your health care provider. °· Wear a well-fitting bra that provides breast support. °· Eat healthy foods. °· Drink enough fluids to keep your urine clear or pale yellow. °· Eat high-fiber foods such as whole grain cereals and breads, brown rice, beans, and fresh fruits and vegetables every day. These foods may help prevent or relieve constipation. °· Follow your health care provider's recommendations regarding resumption of activities such as climbing stairs, driving, lifting, exercising, or traveling. °· Talk to your health care provider about resuming sexual activities. Resumption of sexual activities is dependent upon your risk of infection, your rate of healing, and your comfort and desire to resume sexual activity. °· Try to have someone help you with your household activities and your newborn for at least a few days after you leave the hospital. °· Rest as much as possible. Try to rest or take a nap when your newborn is sleeping. °· Increase your activities gradually. °· Keep all of your scheduled postpartum appointments. It is very important to keep your scheduled follow-up appointments. At these appointments, your health care provider will be checking to make sure that you are healing physically and emotionally. °SEEK MEDICAL CARE IF:  °· You are passing large clots from your vagina. Save any clots to show your health care provider. °· You have a foul smelling discharge from your vagina. °· You have trouble urinating. °· You are urinating frequently. °· You have pain when you urinate. °· You have a change in your bowel movements. °· You have increasing redness, pain, or swelling near your vaginal incision  (episiotomy) or vaginal tear. °· You have pus draining from your episiotomy or vaginal tear. °· Your episiotomy or vaginal tear is separating. °· You have painful, hard, or reddened breasts. °· You have a severe headache. °· You have blurred vision or see spots. °· You feel sad or depressed. °· You have thoughts of hurting yourself or your newborn. °· You have questions about your care, the care of your newborn, or medicines. °· You are dizzy or light-headed. °· You have a rash. °· You have nausea or vomiting. °· You were breastfeeding and have not had a menstrual period within 12 weeks after you stopped breastfeeding. °· You are not breastfeeding and have not had a menstrual period by the 12th week after delivery. °· You have a fever. °SEEK IMMEDIATE MEDICAL CARE IF:  °· You have persistent pain. °· You have chest pain. °· You have shortness of breath. °· You faint. °· You have leg pain. °· You have stomach pain. °· Your vaginal bleeding saturates two or more sanitary pads in 1 hour. °  °This information is not intended to replace advice given to you by your health care provider. Make sure you discuss any questions you have with your health care provider. °  °Document Released: 02/10/2000 Document Revised: 11/03/2014 Document Reviewed: 10/10/2011 °Elsevier   Interactive Patient Education ©2016 Elsevier Inc. ° °

## 2015-03-28 ENCOUNTER — Encounter (HOSPITAL_COMMUNITY): Payer: Self-pay | Admitting: Obstetrics & Gynecology

## 2015-03-30 NOTE — Brief Op Note (Signed)
03/25/2015  10:36 AM  PATIENT:  Melene Plan  40 y.o. female  PRE-OPERATIVE DIAGNOSIS:  desires sterilization  POST-OPERATIVE DIAGNOSIS:  desires sterilization  PROCEDURE:  Procedure(s): POST PARTUM TUBAL LIGATION (Bilateral)  SURGEON:  Surgeon(s) and Role:    * Adam Phenix, MD - Primary    * Federico Flake, MD - Assisting  PHYSICIAN ASSISTANT:   ASSISTANTS: none   ANESTHESIA:   spinal  BLOOD ADMINISTERED:none  DRAINS: none   LOCAL MEDICATIONS USED:  MARCAINE     SPECIMEN:  No Specimen  DISPOSITION OF SPECIMEN:  N/A  COUNTS:  YES  TOURNIQUET:  * No tourniquets in log *  DICTATION: .Note written in EPIC  PLAN OF CARE: currently inpatient  PATIENT DISPOSITION:  PACU - hemodynamically stable.   Delay start of Pharmacological VTE agent (>24hrs) due to surgical blood loss or risk of bleeding: not applicable

## 2015-03-30 NOTE — Op Note (Signed)
Nalla Purdy 03/25/2015  03/25/2015  10:36 AM  PATIENT:  [name] [name]  40 y.o. female  PRE-OPERATIVE DIAGNOSIS:  desires sterilization  POST-OPERATIVE DIAGNOSIS:  desires sterilization  PROCEDURE:  Procedure(s): POST PARTUM TUBAL LIGATION (Bilateral)  SURGEON:  Surgeon(s) and Role:    * Adam Phenix, MD - Primary    * Federico Flake, MD - Assisting  INDICATIONS: 40 y.o. [id number]  with undesired fertility,status post vaginal delivery, desires permanent sterilization. Risks and benefits of procedure discussed with patient including permanence of method, bleeding, infection, injury to surrounding organs and need for additional procedures. Risk failure of 0.5-1% with increased risk of ectopic gestation if pregnancy occurs was also discussed with patient.   FINDINGS:  Normal uterus, tubes, and ovaries.  TECHNIQUE:  The patient was taken to the operating room where her epidural anesthesia was dosed up to surgical level and found to be adequate.  She was then placed in the dorsal supine position and prepped and draped in sterile fashion.  After an adequate timeout was performed, attention was turned to the patient's abdomen where a small transverse skin incision was made through  the umbilical fold. The incision was taken down to the layer of fascia using the scalpel, and fascia was incised, and extended bilaterally using Mayo scissors. The peritoneum was entered in a sharp fashion. Attention was then turned to the patient's uterus, and left fallopian tube was identified and followed out to the fimbriated end.  A Filshie clip was placed on the left fallopian tube about 2 cm from the cornual attachment, with care given to incorporate the underlying mesosalpinx.  A similar process was carried out on the rightl side allowing for bilateral tubal sterilization.  Good hemostasis was noted overall.  Local analgesia was drizzled on both operative sites.The instruments were then removed from the  patient's abdomen and the fascial incision was repaired with 0 Vicryl, and the skin was closed with a 3-0 Vicryl subcuticular stitch. The patient tolerated the procedure well.  Sponge, lap, and needle counts were correct times two.  The patient was then taken to the recovery room awake, extubated and in stable condition.  Federico Flake, MD , MPH, ABFM Family Medicine, OB Fellow [facility]

## 2015-05-04 ENCOUNTER — Ambulatory Visit (INDEPENDENT_AMBULATORY_CARE_PROVIDER_SITE_OTHER): Payer: BLUE CROSS/BLUE SHIELD | Admitting: Family Medicine

## 2015-05-04 ENCOUNTER — Encounter: Payer: Self-pay | Admitting: Family Medicine

## 2015-05-04 DIAGNOSIS — Z8632 Personal history of gestational diabetes: Secondary | ICD-10-CM

## 2015-05-04 NOTE — Progress Notes (Signed)
Patient ID: [name] [name], female   DOB: [birth year], 40 y.o.   MRN: [id number] Post Partum Exam  [name] [name] is a 40 y.o. [id number] female who presents for a postpartum visit. She is 8 weeks postpartum following a spontaneous vaginal delivery. I have fully reviewed the prenatal and intrapartum course. The delivery was at 39 gestational weeks.  Anesthesia: none. Postpartum course has been unremarkable. Baby's course has been unremarkable Baby is feeding by bottle - Similac Advance. Bleeding no bleeding. Bowel function is normal. Bladder function is normal. Patient is not sexually active. Contraception method is tubal ligation. Postpartum depression screening: negative.  The following portions of the patient's history were reviewed and updated as appropriate: allergies, current medications, past family history, past medical history, past social history, past surgical history and problem list.  Review of Systems Pertinent items noted in HPI and remainder of comprehensive ROS otherwise negative.   Objective:    BP 116/78 mmHg  Pulse 78  Resp 16  Ht 5\' 5"  (1.651 m)  Wt 211 lb (95.709 kg)  BMI 35.11 kg/m2  Breastfeeding? Yes  General:  alert  Abdomen: soft, non-tender; bowel sounds normal; no masses,  no organomegaly   Vulva:  normal  Vagina: normal vagina, no discharge, exudate, lesion, or erythema  Cervix:  no cervical motion tenderness  Corpus: normal size, contour, position, consistency, mobility, non-tender  Adnexa:  normal adnexa        Assessment:    Normal postpartum exam. Pap smear not done at today's visit.   Plan:    1. Contraception: tubal ligation 2. Pap due 2019 3. Follow up in: 1 year or as needed.

## 2015-05-04 NOTE — Patient Instructions (Signed)
Postpartum Depression and Baby Blues °The postpartum period begins right after the birth of a baby. During this time, there is often a great amount of joy and excitement. It is also a time of many changes in the life of the parents. Regardless of how many times a mother gives birth, each child brings new challenges and dynamics to the family. It is not unusual to have feelings of excitement along with confusing shifts in moods, emotions, and thoughts. All mothers are at risk of developing postpartum depression or the "baby blues." These mood changes can occur right after giving birth, or they may occur many months after giving birth. The baby blues or postpartum depression can be mild or severe. Additionally, postpartum depression can go away rather quickly, or it can be a long-term condition.  °CAUSES °Raised hormone levels and the rapid drop in those levels are thought to be a main cause of postpartum depression and the baby blues. A number of hormones change during and after pregnancy. Estrogen and progesterone usually decrease right after the delivery of your baby. The levels of thyroid hormone and various cortisol steroids also rapidly drop. Other factors that play a role in these mood changes include major life events and genetics.  °RISK FACTORS °If you have any of the following risks for the baby blues or postpartum depression, know what symptoms to watch out for during the postpartum period. Risk factors that may increase the likelihood of getting the baby blues or postpartum depression include: °· Having a personal or family history of depression.   °· Having depression while being pregnant.   °· Having premenstrual mood issues or mood issues related to oral contraceptives. °· Having a lot of life stress.   °· Having marital conflict.   °· Lacking a social support network.   °· Having a baby with special needs.   °· Having health problems, such as diabetes.   °SIGNS AND SYMPTOMS °Symptoms of baby blues  include: °· Brief changes in mood, such as going from extreme happiness to sadness. °· Decreased concentration.   °· Difficulty sleeping.   °· Crying spells, tearfulness.   °· Irritability.   °· Anxiety.   °Symptoms of postpartum depression typically begin within the first month after giving birth. These symptoms include: °· Difficulty sleeping or excessive sleepiness.   °· Marked weight loss.   °· Agitation.   °· Feelings of worthlessness.   °· Lack of interest in activity or food.   °Postpartum psychosis is a very serious condition and can be dangerous. Fortunately, it is rare. Displaying any of the following symptoms is cause for immediate medical attention. Symptoms of postpartum psychosis include:  °· Hallucinations and delusions.   °· Bizarre or disorganized behavior.   °· Confusion or disorientation.   °DIAGNOSIS  °A diagnosis is made by an evaluation of your symptoms. There are no medical or lab tests that lead to a diagnosis, but there are various questionnaires that a health care provider may use to identify those with the baby blues, postpartum depression, or psychosis. Often, a screening tool called the Edinburgh Postnatal Depression Scale is used to diagnose depression in the postpartum period.  °TREATMENT °The baby blues usually goes away on its own in 1-2 weeks. Social support is often all that is needed. You will be encouraged to get adequate sleep and rest. Occasionally, you may be given medicines to help you sleep.  °Postpartum depression requires treatment because it can last several months or longer if it is not treated. Treatment may include individual or group therapy, medicine, or both to address any social, physiological, and psychological   factors that may play a role in the depression. Regular exercise, a healthy diet, rest, and social support may also be strongly recommended.  °Postpartum psychosis is more serious and needs treatment right away. Hospitalization is often needed. °HOME CARE  INSTRUCTIONS °· Get as much rest as you can. Nap when the baby sleeps.   °· Exercise regularly. Some women find yoga and walking to be beneficial.   °· Eat a balanced and nourishing diet.   °· Do little things that you enjoy. Have a cup of tea, take a bubble bath, read your favorite magazine, or listen to your favorite music. °· Avoid alcohol.   °· Ask for help with household chores, cooking, grocery shopping, or running errands as needed. Do not try to do everything.   °· Talk to people close to you about how you are feeling. Get support from your partner, family members, friends, or other new moms. °· Try to stay positive in how you think. Think about the things you are grateful for.   °· Do not spend a lot of time alone.   °· Only take over-the-counter or prescription medicine as directed by your health care provider. °· Keep all your postpartum appointments.   °· Let your health care provider know if you have any concerns.   °SEEK MEDICAL CARE IF: °You are having a reaction to or problems with your medicine. °SEEK IMMEDIATE MEDICAL CARE IF: °· You have suicidal feelings.   °· You think you may harm the baby or someone else. °MAKE SURE YOU: °· Understand these instructions. °· Will watch your condition. °· Will get help right away if you are not doing well or get worse. °  °This information is not intended to replace advice given to you by your health care provider. Make sure you discuss any questions you have with your health care provider. °  °Document Released: 11/17/2003 Document Revised: 02/17/2013 Document Reviewed: 11/24/2012 °Elsevier Interactive Patient Education ©2016 [employer] ° °

## 2015-05-04 NOTE — Progress Notes (Deleted)
  Subjective:     [name] [name] is a 40 y.o. female who presents for a postpartum visit. She is 6 weeks postpartum following a spontaneous vaginal delivery. I have fully reviewed the prenatal and intrapartum course. The delivery was at 39 gestational weeks. Outcome: spontaneous vaginal delivery. Anesthesia: epidural. Postpartum course has been normal. Baby's course has been unremarkable. Baby is feeding by both breast and bottle - Similac with Iron. Bleeding no bleeding. Bowel function is normal. Bladder function is normal. Patient is not sexually active. Contraception method is tubal ligation. Postpartum depression screening: negative.  The following portions of the patient's history were reviewed and updated as appropriate: allergies, current medications, past family history, past medical history, past social history, past surgical history and problem list.  Review of Systems Pertinent items are noted in HPI.   Objective:    BP 142/83 mmHg  Pulse 67  Resp 16  Ht 5\' 4"  (1.626 m)  Wt 164 lb (74.39 kg)  BMI 28.14 kg/m2  Breastfeeding? No  General:  alert, cooperative and appears stated age  Abdomen: soft, non-tender; bowel sounds normal; no masses,  no organomegaly   Vulva:  normal  Vagina: normal vagina, no discharge, exudate, lesion, or erythema  Cervix:  no cervical motion tenderness  Corpus: normal size, contour, position, consistency, mobility, non-tender  Adnexa:  normal adnexa        Assessment:     Normal postpartum exam. Pap smear not done at today's visit.   Plan:    1. Contraception: tubal ligation 2. Pap due 2019 3. Follow up in: 1 year or as needed.

## 2015-05-23 ENCOUNTER — Other Ambulatory Visit (INDEPENDENT_AMBULATORY_CARE_PROVIDER_SITE_OTHER): Payer: BLUE CROSS/BLUE SHIELD | Admitting: *Deleted

## 2015-05-23 DIAGNOSIS — Z8632 Personal history of gestational diabetes: Secondary | ICD-10-CM

## 2015-05-23 NOTE — Progress Notes (Signed)
Pt has hx gestational diabetes with previous pregnancy, here to do 2 hr GTT.

## 2015-05-26 LAB — GLUCOSE TOLERANCE, 2 HOURS
GLUCOSE 1 HOUR: 108
GLUCOSE 2 HOUR: 100
GLUCOSE FASTING: 94

## 2015-05-26 LAB — 2HR GTT W 1 HR, CARPENTER, 75 G
GLUCOSE, 2 HR, GEST: 100 mg/dL (ref <153)
GLUCOSE, FASTING, GEST: 94 mg/dL — AB (ref 65–91)
Glucose, 1 Hr, Gest: 108 mg/dL (ref <180)

## 2015-05-31 ENCOUNTER — Encounter: Payer: Self-pay | Admitting: *Deleted

## 2015-05-31 ENCOUNTER — Other Ambulatory Visit: Payer: Self-pay | Admitting: *Deleted

## 2015-05-31 DIAGNOSIS — Z8632 Personal history of gestational diabetes: Secondary | ICD-10-CM

## 2015-06-20 ENCOUNTER — Telehealth: Payer: Self-pay | Admitting: *Deleted

## 2015-06-20 NOTE — Telephone Encounter (Signed)
Called pt to adv GTT normal - Pt expressed understanding.

## 2015-06-20 NOTE — Telephone Encounter (Signed)
-----   Message from Tereso NewcomerUgonna A Anyanwu, MD sent at 06/17/2015  4:50 PM EDT ----- Normal 2 hr GTT. Please call to inform patient of results.

## 2018-03-03 ENCOUNTER — Emergency Department (HOSPITAL_COMMUNITY): Payer: BLUE CROSS/BLUE SHIELD

## 2018-03-03 ENCOUNTER — Emergency Department (HOSPITAL_COMMUNITY)
Admission: EM | Admit: 2018-03-03 | Discharge: 2018-03-03 | Disposition: A | Discharge status: Home / Self Care | Payer: BLUE CROSS/BLUE SHIELD | Attending: Emergency Medicine | Admitting: Emergency Medicine

## 2018-03-03 ENCOUNTER — Encounter (HOSPITAL_COMMUNITY): Payer: Self-pay | Admitting: *Deleted

## 2018-03-03 DIAGNOSIS — M545 Low back pain, unspecified: Secondary | ICD-10-CM

## 2018-03-03 DIAGNOSIS — Z79899 Other long term (current) drug therapy: Secondary | ICD-10-CM | POA: Diagnosis not present

## 2018-03-03 LAB — POC URINE PREG, ED: Preg Test, Ur: NEGATIVE

## 2018-03-03 NOTE — ED Provider Notes (Signed)
[facility] EMERGENCY DEPARTMENT Provider Note   CSN: [id number] Arrival date & time: 03/03/18  0908    History   Chief Complaint No chief complaint on file.   HPI [name] [name] is a 43 y.o. female.  HPI    43 year old female presents today with complaints of left lower back pain.  Patient notes a 2-week history of pain in her lower lumbar region.  She notes this is left of the midline.  She notes this is worse when bending over or lifting.  Patient does note a history of sciatica and feels similar.  Patient denies any fever nausea vomiting dysuria, or any urinary changes.  She denies any vaginal discharge or bleeding.  Patient notes occasionally she does have left lower quadrant discomfort, nonsevere.  Patient notes that originally when symptoms started she took Advil at home which improved her symptoms.   Past Medical History:  Diagnosis Date  . Gestational diabetes mellitus (GDM), antepartum   . Medical history non-contributory     Patient Active Problem List   Diagnosis Date Noted  . History of gestational diabetes 01/26/2015    Past Surgical History:  Procedure Laterality Date  . NO PAST SURGERIES    . none    . TUBAL LIGATION Bilateral 03/25/2015   Procedure: POST PARTUM TUBAL LIGATION;  Surgeon: Adam Phenix, MD;  Location: [facility];  Service: Gynecology;  Laterality: Bilateral;     OB History    Gravida  5   Para  5   Term  5   Preterm      AB      Living  5     SAB      TAB      Ectopic      Multiple  0   Live Births  5            Home Medications    Prior to Admission medications   Medication Sig Start Date End Date Taking? Authorizing Provider  ACCU-CHEK AVIVA PLUS test strip See admin instructions. 04/21/15   [provider]  ACCU-CHEK SOFTCLIX LANCETS lancets See admin instructions. 04/21/15   [provider]  acetaminophen (TYLENOL) 325 MG tablet Take 2 tablets (650 mg total) by mouth every 4  (four) hours as needed (for pain scale < 4). 03/26/15   Wouk, Wilfred Curtis, MD  ibuprofen (ADVIL,MOTRIN) 600 MG tablet Take 1 tablet (600 mg total) by mouth every 6 (six) hours. 03/26/15   Wouk, Wilfred Curtis, MD  Prenatal Vit-Fe Fumarate-FA (MULTIVITAMIN-PRENATAL) 27-0.8 MG TABS tablet Take 1 tablet by mouth daily at 12 noon.    [provider]    Family History Family History  Problem Relation Age of Onset  . Stroke Father     Social History Social History   Tobacco Use  . Smoking status: Never Smoker  Substance Use Topics  . Alcohol use: No  . Drug use: No     Allergies   Patient has no known allergies.   Review of Systems Review of Systems  All other systems reviewed and are negative.    Physical Exam Updated Vital Signs BP (!) 160/107 (BP Location: Right Arm)   Pulse 69   Temp 97.6 F (36.4 C) (Oral)   Resp 16   Ht 5\' 4"  (1.626 m)   Wt 77.1 kg   LMP 02/10/2018 (Approximate)   SpO2 98%   BMI 29.18 kg/m   Physical Exam Vitals signs and nursing note reviewed.  Constitutional:      Appearance: She is well-developed.  HENT:     Head: Normocephalic and atraumatic.  Eyes:     General: No scleral icterus.       Right eye: No discharge.        Left eye: No discharge.     Conjunctiva/sclera: Conjunctivae normal.     Pupils: Pupils are equal, round, and reactive to light.  Neck:     Musculoskeletal: Normal range of motion.     Vascular: No JVD.     Trachea: No tracheal deviation.  Pulmonary:     Effort: Pulmonary effort is normal.     Breath sounds: No stridor.  Abdominal:     Comments: Soft no masses, no rebound or guarding-minor tenderness with deep palpation of left lower quadrant, this is intermittent with palpation  Musculoskeletal:     Comments: minor TTP of left lateral lower lumbar soft tissue and upper gluteus- Pain worse with forward flexion  Neurological:     Mental Status: She is alert and oriented to person, place, and time.      Coordination: Coordination normal.  Psychiatric:        Behavior: Behavior normal.        Thought Content: Thought content normal.        Judgment: Judgment normal.      ED Treatments / Results  Labs (all labs ordered are listed, but only abnormal results are displayed) Labs Reviewed  POC URINE PREG, ED    EKG None  Radiology Dg Lumbar Spine Complete  Result Date: 03/03/2018 CLINICAL DATA:  Low back pain. EXAM: LUMBAR SPINE - COMPLETE 4+ VIEW COMPARISON:  09/26/2016 FINDINGS: The alignment of the lumbar spine appears normal. Disc spaces are relatively well preserved. Vertebral body heights are well preserved. Mild multi level ventral endplate spurring is identified. No fractures or dislocations identified. IMPRESSION: Mild multilevel lumbar spondylosis. Electronically Signed   By: Signa Kell M.D.   On: 03/03/2018 11:10    Procedures Procedures (including critical care time)  Medications Ordered in ED Medications - No data to display   Initial Impression / Assessment and Plan / ED Course  I have reviewed the triage vital signs and the nursing notes.  Pertinent labs & imaging results that were available during my care of the patient were reviewed by me and considered in my medical decision making (see chart for details).     Labs:   Imaging: DG lumbar  Consults:  Therapeutics:  Discharge Meds:   Assessment/Plan: 43 year old female presents today with lower back pain.  I have very high suspicion for muscular etiology.  This is worse with forward flexion, she has no red flags.  She has intermittent pain in her left lower quadrant nonsevere with no infectious signs or symptoms.  I have lower suspicion for pelvic or intra-abdominal cause.  I have encouraged patient to follow-up this week with her primary care provider for reevaluation, she will use symptomatic means including ibuprofen, and rest.  She will return immediately if she develops any new or worsening signs or  symptoms.  She verbalized understanding and agreement to today's plan.    Final Clinical Impressions(s) / ED Diagnoses   Final diagnoses:  Acute left-sided low back pain without sciatica    ED Discharge Orders    None       Rosalio Loud 03/03/18 1155    Azalia Bilis, MD 03/03/18 1709

## 2018-03-03 NOTE — ED Triage Notes (Signed)
Pt c/o L lower back pain intermittently x 2 wks, denies injury, pt reports LLQ abd pain at times as well, denies dysuria, denies vaginal discharge, denies n/v/d, A&O x4

## 2018-03-03 NOTE — Discharge Instructions (Addendum)
Please contact your primary care provider today and schedule follow-up evaluation within the next week.  If you develop any new or worsening signs or symptoms please return immediately for repeat evaluation.

## 2019-04-22 ENCOUNTER — Encounter (HOSPITAL_COMMUNITY): Payer: Self-pay | Admitting: Emergency Medicine

## 2019-04-22 ENCOUNTER — Emergency Department (HOSPITAL_COMMUNITY)
Admission: EM | Admit: 2019-04-22 | Discharge: 2019-04-23 | Disposition: A | Discharge status: Home / Self Care | Payer: BC Managed Care – PPO | Attending: Emergency Medicine | Admitting: Emergency Medicine

## 2019-04-22 ENCOUNTER — Other Ambulatory Visit: Payer: Self-pay

## 2019-04-22 DIAGNOSIS — N3001 Acute cystitis with hematuria: Secondary | ICD-10-CM | POA: Diagnosis not present

## 2019-04-22 DIAGNOSIS — R319 Hematuria, unspecified: Secondary | ICD-10-CM | POA: Diagnosis present

## 2019-04-22 LAB — CBC WITH DIFFERENTIAL/PLATELET
Abs Immature Granulocytes: 0.05 10*3/uL (ref 0.00–0.07)
Basophils Absolute: 0.1 10*3/uL (ref 0.0–0.1)
Basophils Relative: 1 %
Eosinophils Absolute: 0.2 10*3/uL (ref 0.0–0.5)
Eosinophils Relative: 2 %
HCT: 39.5 % (ref 36.0–46.0)
Hemoglobin: 13.1 g/dL (ref 12.0–15.0)
Immature Granulocytes: 0 %
Lymphocytes Relative: 15 %
Lymphs Abs: 1.9 10*3/uL (ref 0.7–4.0)
MCH: 29 pg (ref 26.0–34.0)
MCHC: 33.2 g/dL (ref 30.0–36.0)
MCV: 87.6 fL (ref 80.0–100.0)
Monocytes Absolute: 0.8 10*3/uL (ref 0.1–1.0)
Monocytes Relative: 6 %
Neutro Abs: 9.9 10*3/uL — ABNORMAL HIGH (ref 1.7–7.7)
Neutrophils Relative %: 76 %
Platelets: 414 10*3/uL — ABNORMAL HIGH (ref 150–400)
RBC: 4.51 MIL/uL (ref 3.87–5.11)
RDW: 12.3 % (ref 11.5–15.5)
WBC: 13 10*3/uL — ABNORMAL HIGH (ref 4.0–10.5)
nRBC: 0 % (ref 0.0–0.2)

## 2019-04-22 LAB — BASIC METABOLIC PANEL
Anion gap: 11 (ref 5–15)
BUN: 12 mg/dL (ref 6–20)
CO2: 27 mmol/L (ref 22–32)
Calcium: 9.2 mg/dL (ref 8.9–10.3)
Chloride: 104 mmol/L (ref 98–111)
Creatinine, Ser: 0.58 mg/dL (ref 0.44–1.00)
GFR calc Af Amer: >60 mL/min (ref >60)
GFR calc non Af Amer: >60 mL/min (ref >60)
Glucose, Bld: 100 mg/dL — ABNORMAL HIGH (ref 70–99)
Potassium: 3.5 mmol/L (ref 3.5–5.1)
Sodium: 142 mmol/L (ref 135–145)

## 2019-04-22 LAB — URINALYSIS, ROUTINE W REFLEX MICROSCOPIC
Bilirubin Urine: NEGATIVE
Glucose, UA: NEGATIVE mg/dL
Ketones, ur: NEGATIVE mg/dL
Nitrite: NEGATIVE
Protein, ur: 30 mg/dL — AB
RBC / HPF: >50 RBC/hpf — ABNORMAL HIGH (ref 0–5)
Specific Gravity, Urine: 1.009 (ref 1.005–1.030)
WBC, UA: >50 WBC/hpf — ABNORMAL HIGH (ref 0–5)
pH: 8 (ref 5.0–8.0)

## 2019-04-22 LAB — I-STAT BETA HCG BLOOD, ED (MC, WL, AP ONLY): I-stat hCG, quantitative: <5 m[IU]/mL (ref <5)

## 2019-04-22 NOTE — ED Triage Notes (Signed)
Patient reports hematuria today and dysuria yesterday , denies fever /no injury .

## 2019-04-23 MED ORDER — CEPHALEXIN 500 MG PO CAPS: 500.0000 mg | ORAL_CAPSULE | Freq: Two times a day (BID) | ORAL | 0 refills | Status: AC

## 2019-04-23 MED ORDER — CEPHALEXIN 250 MG PO CAPS
500.0000 mg | ORAL_CAPSULE | Freq: Once | ORAL | Status: AC
  Administered 2019-04-23: 500 mg via ORAL
  Filled 2019-04-23: qty 2

## 2019-04-23 NOTE — ED Provider Notes (Signed)
[facility] EMERGENCY DEPARTMENT Provider Note   CSN: [id number] Arrival date & time: 04/22/19  2023     History Chief Complaint  Patient presents with  . Hematuria    [name] [name] is a 44 y.o. female.  The history is provided by the patient.  Hematuria This is a new problem. The current episode started 12 to 24 hours ago. The problem has been gradually worsening. Pertinent negatives include no abdominal pain. Exacerbated by: urination. Nothing relieves the symptoms.   Patient reports approximately 2 days ago she began having dysuria.  Over the past 24 hours she has had hematuria.  No fevers or vomiting.  She also has had mild back pain.  No abdominal pain.  No vaginal bleeding.    Past Medical History:  Diagnosis Date  . Gestational diabetes mellitus (GDM), antepartum   . Medical history non-contributory     Patient Active Problem List   Diagnosis Date Noted  . History of gestational diabetes 01/26/2015    Past Surgical History:  Procedure Laterality Date  . NO PAST SURGERIES    . none    . TUBAL LIGATION Bilateral 03/25/2015   Procedure: POST PARTUM TUBAL LIGATION;  Surgeon: Adam Phenix, MD;  Location: [facility];  Service: Gynecology;  Laterality: Bilateral;     OB History    Gravida  5   Para  5   Term  5   Preterm      AB      Living  5     SAB      TAB      Ectopic      Multiple  0   Live Births  5           Family History  Problem Relation Age of Onset  . Stroke Father     Social History   Tobacco Use  . Smoking status: Never Smoker  Substance Use Topics  . Alcohol use: No  . Drug use: No    Home Medications Prior to Admission medications   Medication Sig Start Date End Date Taking? Authorizing Provider  ACCU-CHEK AVIVA PLUS test strip See admin instructions. 04/21/15   [provider]  ACCU-CHEK SOFTCLIX LANCETS lancets See admin instructions. 04/21/15   [provider]  acetaminophen  (TYLENOL) 325 MG tablet Take 2 tablets (650 mg total) by mouth every 4 (four) hours as needed (for pain scale < 4). 03/26/15   Wouk, Wilfred Curtis, MD  cephALEXin (KEFLEX) 500 MG capsule Take 1 capsule (500 mg total) by mouth 2 (two) times daily. 04/23/19   Zadie Rhine, MD  ibuprofen (ADVIL,MOTRIN) 600 MG tablet Take 1 tablet (600 mg total) by mouth every 6 (six) hours. 03/26/15   Wouk, Wilfred Curtis, MD  Prenatal Vit-Fe Fumarate-FA (MULTIVITAMIN-PRENATAL) 27-0.8 MG TABS tablet Take 1 tablet by mouth daily at 12 noon.    [provider]    Allergies    Patient has no known allergies.  Review of Systems   Review of Systems  Gastrointestinal: Negative for abdominal pain and vomiting.  Genitourinary: Positive for dysuria and hematuria. Negative for vaginal bleeding.  Musculoskeletal: Positive for back pain.  All other systems reviewed and are negative.   Physical Exam Updated Vital Signs BP 124/68   Pulse 84   Temp 97.6 F (36.4 C) (Oral)   Resp 16   Ht 1.626 m (5\' 4" )   Wt 78.5 kg   LMP 04/12/2019   SpO2 98%  BMI 29.70 kg/m   Physical Exam CONSTITUTIONAL: Well developed/well nourished HEAD: Normocephalic/atraumatic EYES: EOMI/PERRL NECK: supple no meningeal signs SPINE/BACK:entire spine nontender CV: S1/S2 noted, no murmurs/rubs/gallops noted LUNGS: Lungs are clear to auscultation bilaterally, no apparent distress ABDOMEN: soft, nontender, no rebound or guarding, bowel sounds noted throughout abdomen GU:no cva tenderness NEURO: Pt is awake/alert/appropriate, moves all extremitiesx4.  No facial droop.   EXTREMITIES: pulses normal/equal, full ROM SKIN: warm, color normal PSYCH: no abnormalities of mood noted, alert and oriented to situation  ED Results / Procedures / Treatments   Labs (all labs ordered are listed, but only abnormal results are displayed) Labs Reviewed  URINALYSIS, ROUTINE W REFLEX MICROSCOPIC - Abnormal; Notable for the following components:       Result Value   Color, Urine STRAW (*)    APPearance HAZY (*)    Hgb urine dipstick LARGE (*)    Protein, ur 30 (*)    Leukocytes,Ua LARGE (*)    RBC / HPF >50 (*)    WBC, UA >50 (*)    Bacteria, UA RARE (*)    All other components within normal limits  CBC WITH DIFFERENTIAL/PLATELET - Abnormal; Notable for the following components:   WBC 13.0 (*)    Platelets 414 (*)    Neutro Abs 9.9 (*)    All other components within normal limits  BASIC METABOLIC PANEL - Abnormal; Notable for the following components:   Glucose, Bld 100 (*)    All other components within normal limits  I-STAT BETA HCG BLOOD, ED ([facility], [facility], [facility] ONLY)    EKG None  Radiology No results found.  Procedures Procedures  Medications Ordered in ED Medications  cephALEXin (KEFLEX) capsule 500 mg (500 mg Oral Given 04/23/19 0215)    ED Course  I have reviewed the triage vital signs and the nursing notes.  Pertinent labs  results that were available during my care of the patient were reviewed by me and considered in my medical decision making (see chart for details).    MDM Rules/Calculators/A&P                      Patient presents with dysuria and hematuria.  Patient has obvious UTI.  She is in no acute distress, not septic appearing. Will start on Keflex. Final Clinical Impression(s) / ED Diagnoses Final diagnoses:  Acute cystitis with hematuria    Rx / DC Orders ED Discharge Orders         Ordered    cephALEXin (KEFLEX) 500 MG capsule  2 times daily     04/23/19 0137           Ripley Fraise, MD 04/23/19 [id number]

## 2019-04-23 NOTE — ED Notes (Signed)
Patient verbalizes understanding of discharge instructions. Opportunity for questioning and answers were provided. Armband removed by staff, pt discharged from ED.  

## 2019-06-09 IMAGING — CR DG LUMBAR SPINE COMPLETE 4+V
5 series · 5 of 5 positions shown · non-contrast
Comparison: 09/26/2016

CLINICAL DATA: Low back pain.

EXAM:
LUMBAR SPINE - COMPLETE 4+ VIEW

[l-spine ap]
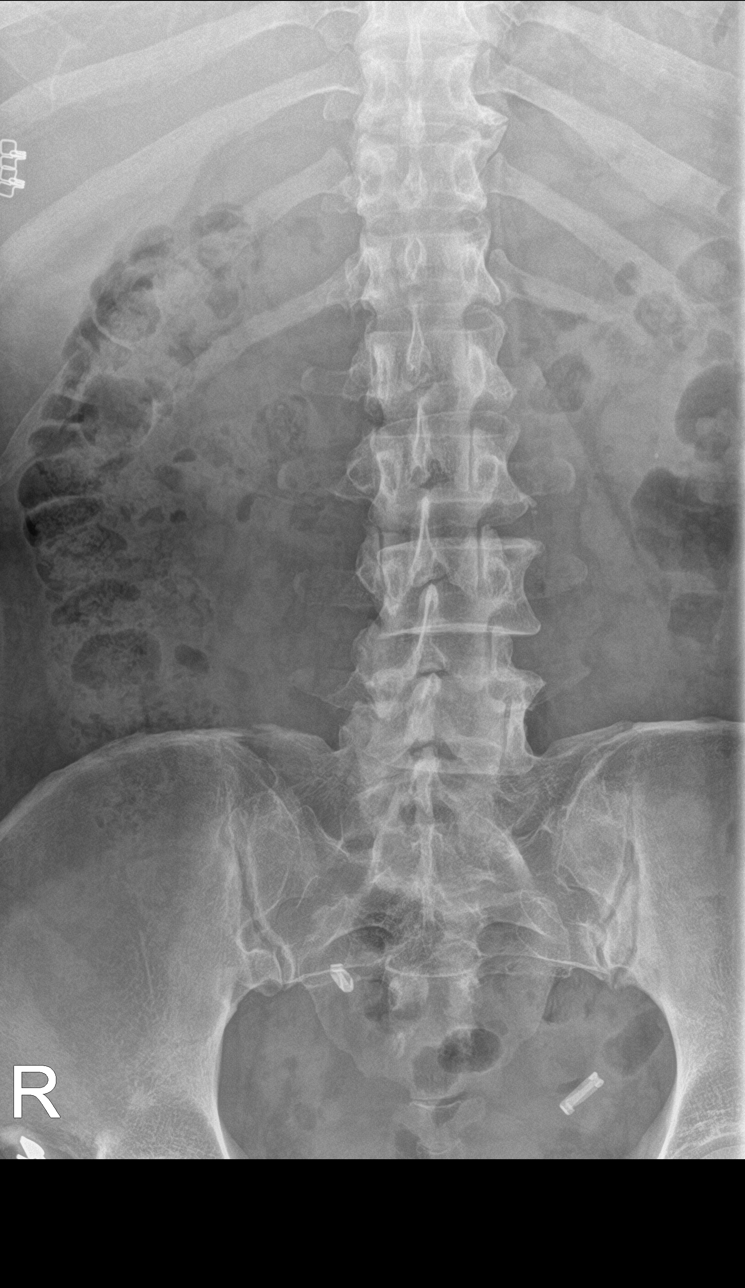

[l-spine obl (1 of 2)]
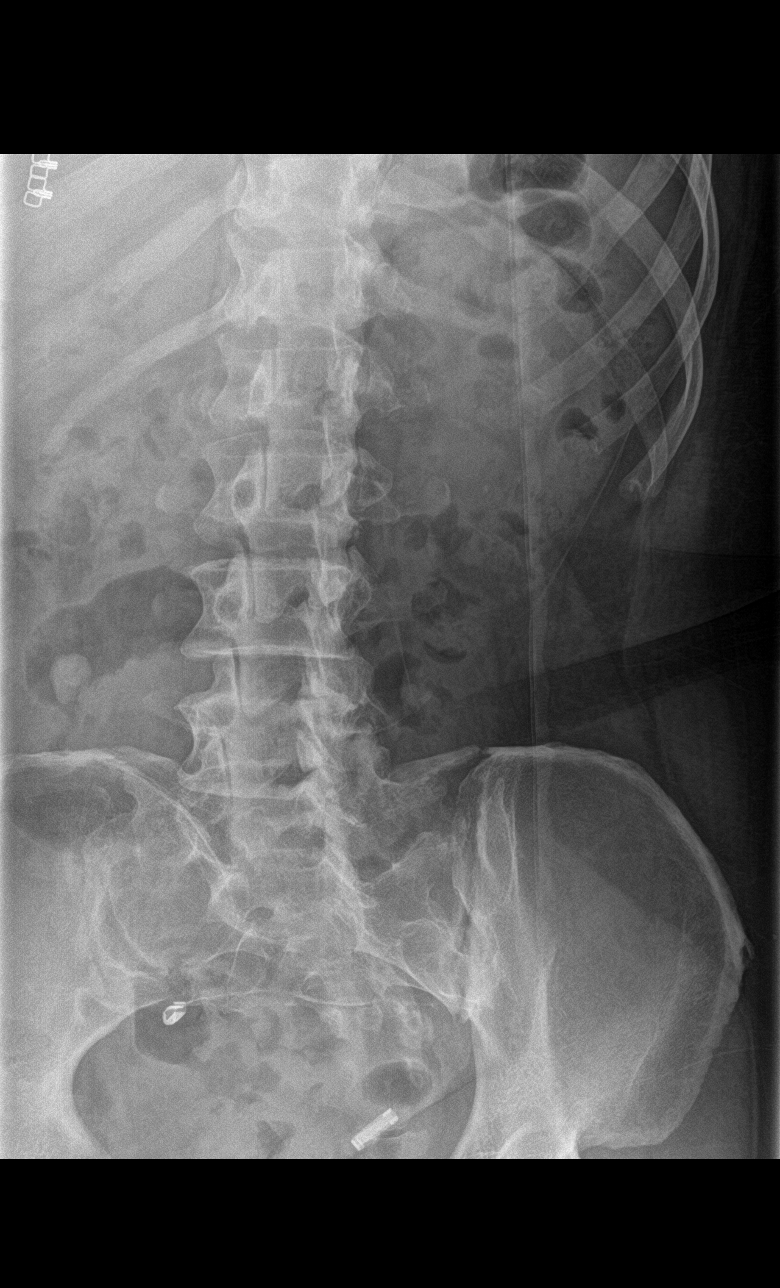

[l-spine obl (2 of 2)]
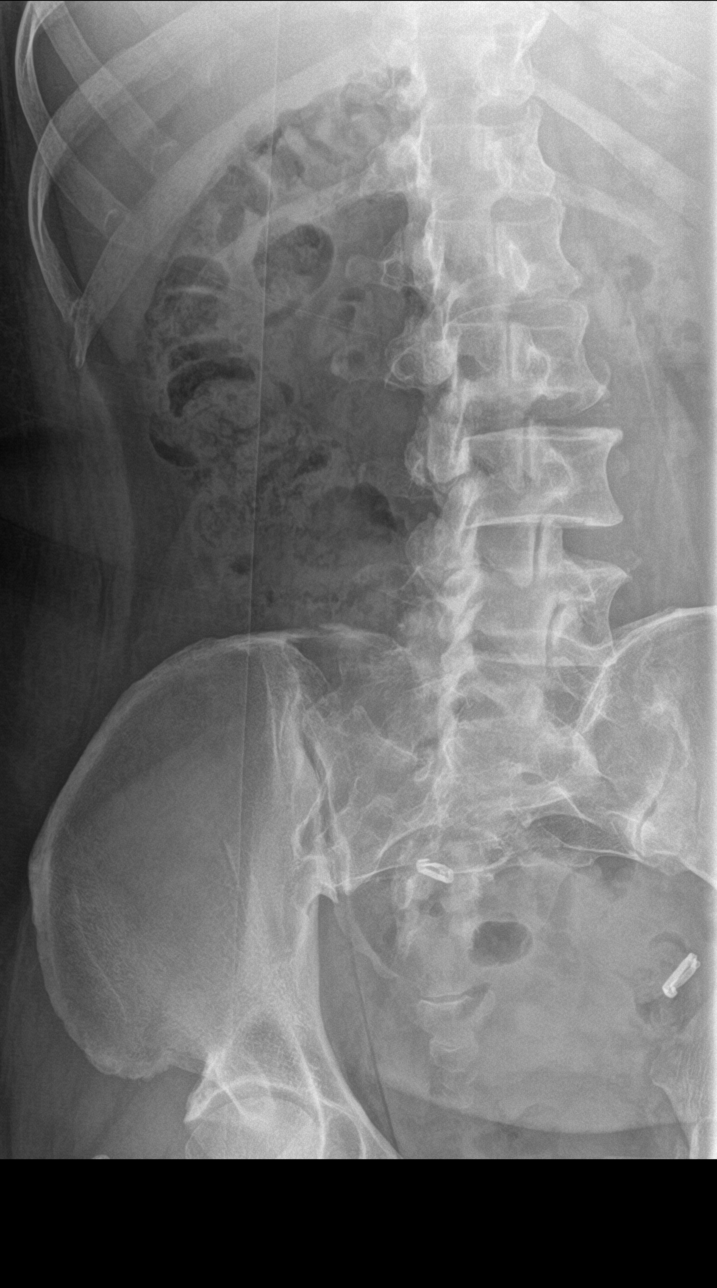

[l-spine lat]
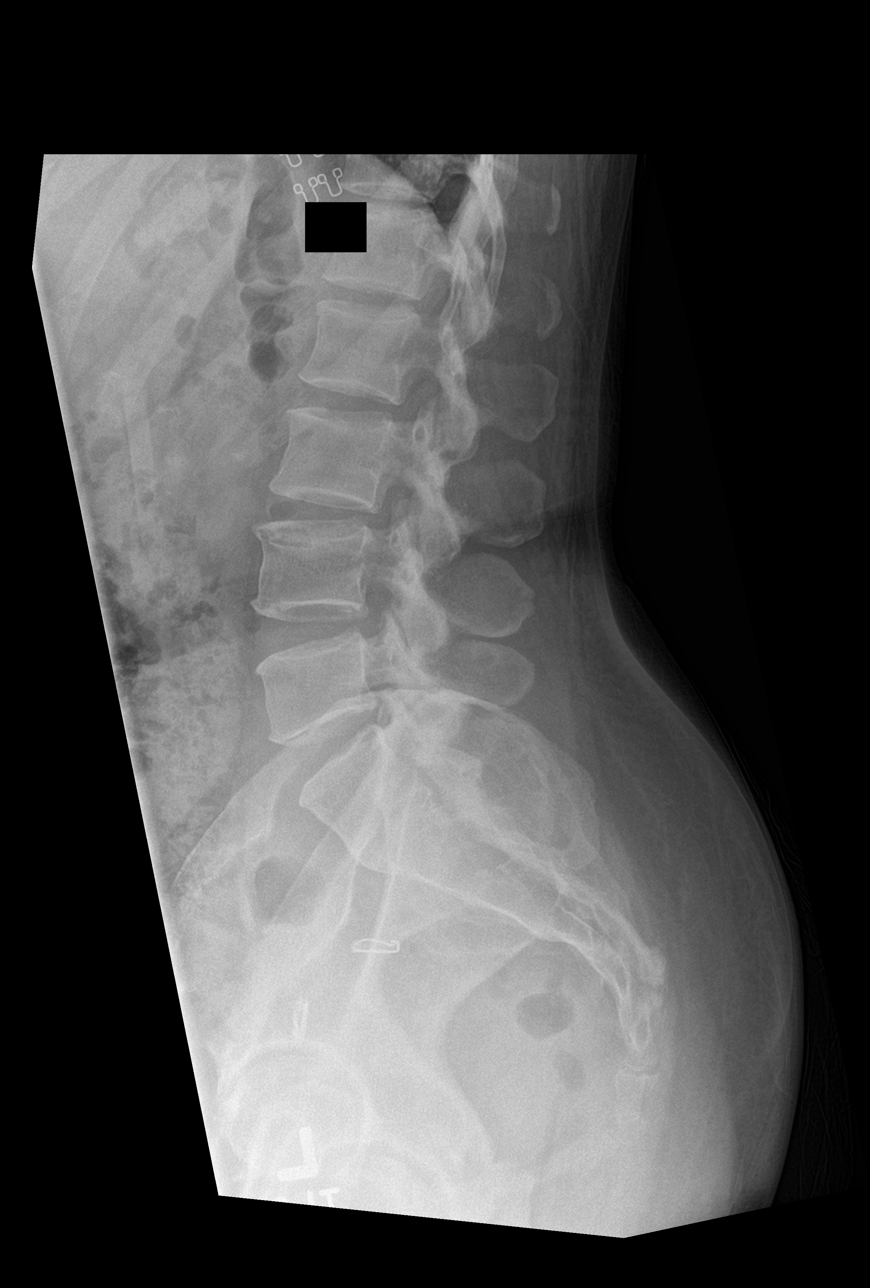

[l-spine spot]
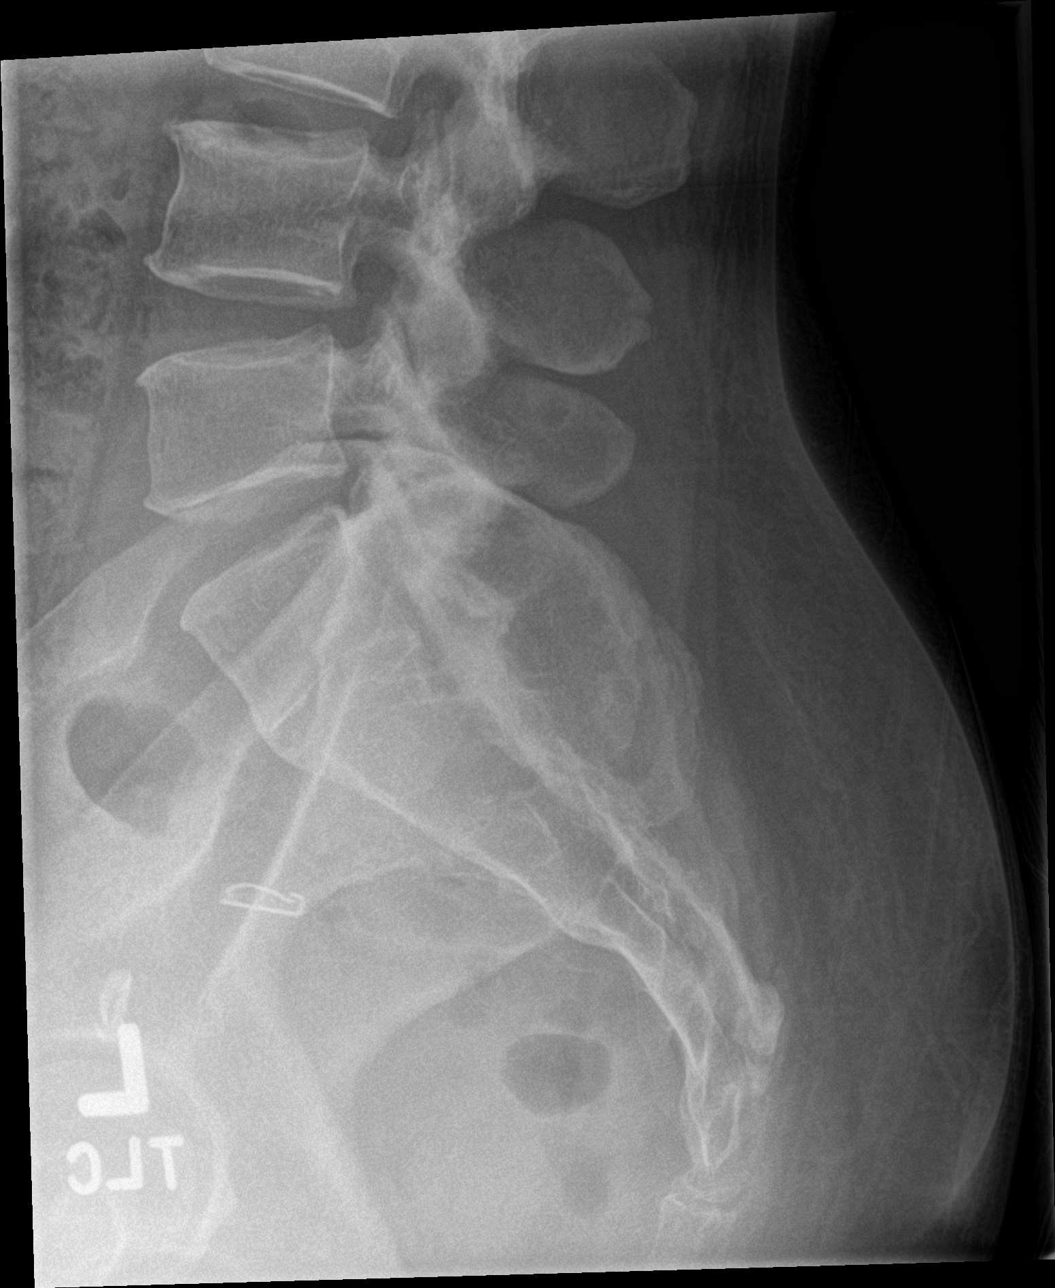

[5 of 5 positions shown; findings below may reference images not displayed]

FINDINGS: The alignment of the lumbar spine appears normal. Disc spaces are
relatively well preserved. Vertebral body heights are well
preserved. Mild multi level ventral endplate spurring is identified.
No fractures or dislocations identified.
IMPRESSION: Mild multilevel lumbar spondylosis.
